# Patient Record
Sex: Male | Born: 1990 | Race: White | Hispanic: No | Marital: Single | Smoking: Current every day smoker
Health system: Southern US, Community
[De-identification: ages and names within clinical notes are randomized; demographics above are authoritative.]

## PROBLEM LIST (undated history)

## (undated) DIAGNOSIS — F1910 Other psychoactive substance abuse, uncomplicated: Secondary | ICD-10-CM

## (undated) DIAGNOSIS — F419 Anxiety disorder, unspecified: Secondary | ICD-10-CM

## (undated) DIAGNOSIS — F329 Major depressive disorder, single episode, unspecified: Secondary | ICD-10-CM

## (undated) DIAGNOSIS — F32A Depression, unspecified: Secondary | ICD-10-CM

## (undated) DIAGNOSIS — F1010 Alcohol abuse, uncomplicated: Secondary | ICD-10-CM

## (undated) HISTORY — DX: Major depressive disorder, single episode, unspecified: F32.9

## (undated) HISTORY — DX: Depression, unspecified: F32.A

## (undated) HISTORY — PX: OTHER SURGICAL HISTORY: SHX169

---
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
## 2011-01-14 IMAGING — CR DG FINGER RING 2+V*L*
1 series · 1 of 1 positions shown · non-contrast
Comparison: None.

CLINICAL DATA: Left fourth finger injury

LEFT RING FINGER 2+V

[view not recorded]
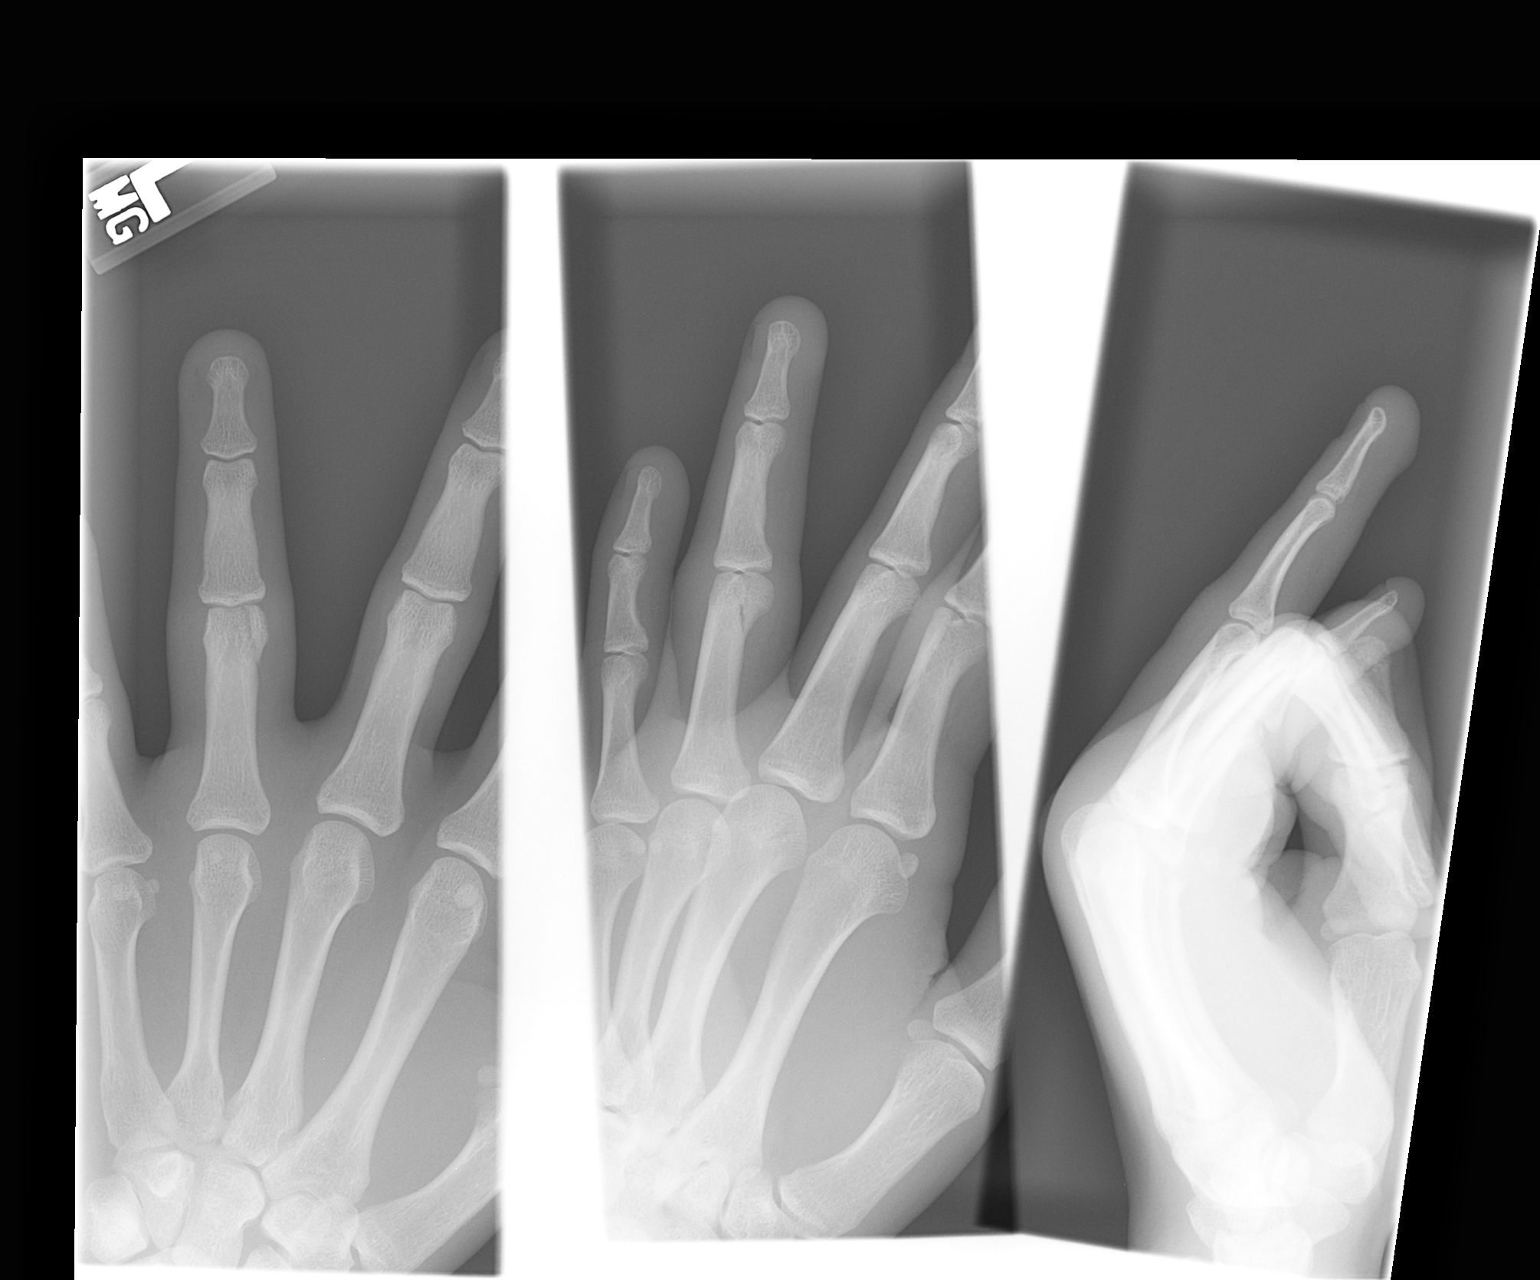

[1 of 1 positions shown; findings below may reference images not displayed]

FINDINGS: There is oblique nondisplaced fracture distal aspect of
proximal phalanx left fourth finger. The fracture line is seen
extending into the articular surface.  No radiopaque foreign body
is noted.
IMPRESSION: Oblique nondisplaced fracture distal aspect of the proximal phalanx
left fourth finger

## 2011-02-25 ENCOUNTER — Emergency Department (HOSPITAL_COMMUNITY)
Admission: EM | Admit: 2011-02-25 | Discharge: 2011-02-25 | Disposition: A | Discharge status: Home / Self Care | Payer: 59 | Attending: Emergency Medicine | Admitting: Emergency Medicine

## 2011-02-25 DIAGNOSIS — X58XXXA Exposure to other specified factors, initial encounter: Secondary | ICD-10-CM | POA: Insufficient documentation

## 2011-02-25 DIAGNOSIS — F39 Unspecified mood [affective] disorder: Secondary | ICD-10-CM

## 2011-02-25 DIAGNOSIS — S51809A Unspecified open wound of unspecified forearm, initial encounter: Secondary | ICD-10-CM | POA: Insufficient documentation

## 2011-02-25 DIAGNOSIS — F1910 Other psychoactive substance abuse, uncomplicated: Secondary | ICD-10-CM | POA: Insufficient documentation

## 2011-02-25 LAB — BASIC METABOLIC PANEL
BUN: 8 mg/dL (ref 6–23)
Calcium: 9.3 mg/dL (ref 8.4–10.5)
Creatinine, Ser: 0.92 mg/dL (ref 0.4–1.5)
GFR calc Af Amer: >60 mL/min (ref >60)
GFR calc non Af Amer: >60 mL/min (ref >60)

## 2011-02-25 LAB — RAPID URINE DRUG SCREEN, HOSP PERFORMED
Benzodiazepines: NOT DETECTED
Cocaine: POSITIVE — AB
Opiates: NOT DETECTED
Tetrahydrocannabinol: POSITIVE — AB

## 2011-02-25 LAB — DIFFERENTIAL
Basophils Relative: 0 % (ref 0–1)
Eosinophils Absolute: 0.1 10*3/uL (ref 0.0–0.7)
Eosinophils Relative: 1 % (ref 0–5)
Lymphs Abs: 2.4 10*3/uL (ref 0.7–4.0)
Monocytes Relative: 7 % (ref 3–12)
Neutrophils Relative %: 75 % (ref 43–77)

## 2011-02-25 LAB — CBC
MCH: 31.6 pg (ref 26.0–34.0)
MCV: 88.2 fL (ref 78.0–100.0)
Platelets: 260 10*3/uL (ref 150–400)
RDW: 13.6 % (ref 11.5–15.5)

## 2011-02-26 ENCOUNTER — Emergency Department (HOSPITAL_COMMUNITY)
Admission: EM | Admit: 2011-02-26 | Discharge: 2011-02-26 | Disposition: A | Discharge status: Home / Self Care | Payer: 59 | Attending: Emergency Medicine | Admitting: Emergency Medicine

## 2011-02-26 DIAGNOSIS — F411 Generalized anxiety disorder: Secondary | ICD-10-CM | POA: Insufficient documentation

## 2011-02-26 DIAGNOSIS — F1910 Other psychoactive substance abuse, uncomplicated: Secondary | ICD-10-CM | POA: Insufficient documentation

## 2011-09-20 ENCOUNTER — Ambulatory Visit: Payer: 59 | Admitting: Family Medicine

## 2011-11-14 ENCOUNTER — Ambulatory Visit: Payer: 59 | Admitting: Family Medicine

## 2012-02-19 ENCOUNTER — Encounter: Payer: Self-pay | Admitting: Family

## 2012-02-19 ENCOUNTER — Ambulatory Visit (INDEPENDENT_AMBULATORY_CARE_PROVIDER_SITE_OTHER): Payer: 59 | Admitting: Family

## 2012-02-19 DIAGNOSIS — G4700 Insomnia, unspecified: Secondary | ICD-10-CM

## 2012-02-19 DIAGNOSIS — J309 Allergic rhinitis, unspecified: Secondary | ICD-10-CM

## 2012-02-19 MED ORDER — FEXOFENADINE-PSEUDOEPHED ER 180-240 MG PO TB24: 1.0000 | ORAL_TABLET | Freq: Every day | ORAL | Status: AC

## 2012-02-19 MED ORDER — TRAZODONE HCL 50 MG PO TABS: 50.0000 mg | ORAL_TABLET | Freq: Every evening | ORAL | Status: DC | PRN

## 2012-02-19 NOTE — Patient Instructions (Signed)

## 2012-02-19 NOTE — Progress Notes (Signed)
  Subjective:    Patient ID: Roy Rogers, male    DOB: 10-15-1991, 21 y.o.   MRN: [id number]  HPI 21 year old white male, nonsmoker new patient to the practice is in with complaints of sneezing, cough, runny nose, itchy and watery eyes x4 days. He has not taken any medications over-the-counter. Has a history of allergic rhinitis.  Patient also has concerns of difficulty falling asleep at night x1 year. His girlfriend reports that he is anxious throughout the day and has difficulty concentrating. Patient declines these concerns. He has a family history of anxiety and his mother, maternal grandmother.   The patient consumes about 3-4 cups of caffeine a day. Denies any illicit drug use   Review of Systems  Constitutional: Negative.   Respiratory: Negative.   Cardiovascular: Negative.   Musculoskeletal: Negative.   Neurological: Negative.   Hematological: Negative.   Psychiatric/Behavioral: Positive for sleep disturbance.   Past Medical History  Diagnosis Date  . Depression     History   Social History  . Marital Status: Single    Spouse Name: N/A    Number of Children: N/A  . Years of Education: 10th Grade   Occupational History  . Not on file.   Social History Main Topics  . Smoking status: Never Smoker   . Smokeless tobacco: Not on file  . Alcohol Use: Yes  . Drug Use: Not on file  . Sexually Active: Not on file   Other Topics Concern  . Not on file   Social History Narrative  . No narrative on file    Past Surgical History  Procedure Date  . None     Family History  Problem Relation Age of Onset  . Mental illness Other   . Diabetes Other   . Early death Other     No Known Allergies  Current Outpatient Prescriptions on File Prior to Visit  Medication Sig Dispense Refill  . traZODone (DESYREL) 50 MG tablet Take 1 tablet (50 mg total) by mouth at bedtime as needed for sleep.  30 tablet  3    BP 124/80  Pulse 73  Temp(Src) 98.1 F (36.7 C)  (Oral)  Ht 5\' 9"  (1.753 m)  Wt 173 lb (78.472 kg)  BMI 25.55 kg/m2  SpO2 98%chart    Objective:   Physical Exam  Constitutional: He is oriented to person, place, and time. He appears well-developed and well-nourished.  Neck: Normal range of motion. Neck supple.  Cardiovascular: Normal rate, regular rhythm and normal heart sounds.   Pulmonary/Chest: Effort normal and breath sounds normal.  Neurological: He is alert and oriented to person, place, and time.  Skin: Skin is warm and dry.  Psychiatric: He has a normal mood and affect.          Assessment & Plan:  Assessment: Allergic rhinitis, insomnia  Plan: Allegra-D 24 hours once daily. Lifestyle modifications to include decreasing his caffeine intake. Patient will stop caffeine intake after 6 PM. Will try trazodone 50 mg once daily at bedtime to help her sleep. His girlfriend alleges that he's taken his medication in the past and has not follicular was effective. Patient isn't sure he's taken it before. I have advised that any control medications with regards to sleep will not be used, due to his potential for abuse and dependence. They may consult psychiatry if they want any benzodiazepines.

## 2012-03-18 ENCOUNTER — Ambulatory Visit: Payer: 59 | Admitting: Family

## 2013-06-23 ENCOUNTER — Telehealth: Payer: Self-pay | Admitting: Family

## 2013-06-23 NOTE — Telephone Encounter (Signed)
Ok with me 

## 2013-06-23 NOTE — Telephone Encounter (Signed)
Pt would like to switch to Dr Kirtland Bouchard. Can I Sch?

## 2013-06-23 NOTE — Telephone Encounter (Signed)
ok 

## 2013-06-24 NOTE — Telephone Encounter (Signed)
Pt notified, appt scheduled

## 2013-07-03 ENCOUNTER — Ambulatory Visit: Payer: 59 | Admitting: Internal Medicine

## 2013-07-09 ENCOUNTER — Encounter: Payer: Self-pay | Admitting: Internal Medicine

## 2013-07-09 ENCOUNTER — Ambulatory Visit (INDEPENDENT_AMBULATORY_CARE_PROVIDER_SITE_OTHER): Payer: 59 | Admitting: Internal Medicine

## 2013-07-09 VITALS — BP 130/80 | HR 71 | Temp 97.9°F | Resp 18 | Wt 169.0 lb

## 2013-07-09 DIAGNOSIS — F4001 Agoraphobia with panic disorder: Secondary | ICD-10-CM

## 2013-07-09 DIAGNOSIS — Z0000 Encounter for general adult medical examination without abnormal findings: Secondary | ICD-10-CM

## 2013-07-09 MED ORDER — ESCITALOPRAM OXALATE 10 MG PO TABS: 10.0000 mg | ORAL_TABLET | Freq: Every day | ORAL | Status: DC

## 2013-07-09 NOTE — Patient Instructions (Addendum)
Take medications as prescribed  Consider counseling as recommended  Return in 6 weeks for followupAgoraphobia Agoraphobia is a type of anxiety disorder. Anxiety is intense fear and worry. Agoraphobia is an intense fear of losing control or having a panic attack while in public. During a panic attack, you may experience the following things:  Dizziness.  Shortness of breath.  Chest pain.  Sweating.  Nausea.  Diarrhea.  Choking sensation.  Trembling or shaking.  Fear of going crazy.  Fear of dying. You may fear not being able to escape a situation, being embarrassed, or not having help available to you. This fear can make it difficult to be in crowds, to travel, or even to leave the house. The panic may seem like more than you can cope with.  RISK FACTORS Agoraphobia can affect people at most any age. It usually starts during the teenage years or in young adulthood. Women have agoraphobia more often than men. No one knows exactly what causes agoraphobia. However, there are certain risk factors that are associated with agoraphobia:  Extreme stress, brought on by traumatic situations, such as physical or sexual abuse.  History of drug or alcohol abuse.  Family history of anxiety. SYMPTOMS   Fear of leaving home.  Fear of being someplace where you cannot get out quickly.  Feelings of helplessness.  Fear of being alone. DIAGNOSIS  To determine if you have agoraphobia, your caregiver will probably ask you about:  Fears you may have.  Changes in your behavior because of these fears.  Drug and alcohol use. TREATMENT  Psychotherapy and medicine can help people with agoraphobia. Medicine can work immediately to reduce symptoms of panic or reduce general levels of anxiety, whereas psychotherapy has been demonstrated to show better long-term outcomes. They often are used together.  Psychotherapy may include:  Cognitive behavioral therapy. This type of therapy helps you  figure out what causes your fears. Your fear might be caused by your thoughts or certain situations. The therapy will then help you learn ways to reduce these fears.  Exposure therapy. Often, the more fear is avoided, the greater it becomes. Exposure therapy helps you face the things that cause your fears by learning relaxation techniques while you approach what you fear. Meditation and deep breathing are two examples.  Medicines for agoraphobia may include:  Antidepressants. These drugs affect certain chemicals in your brain. Selective serotonin reuptake inhibitors are one type that can decrease general levels of anxiety and help prevent panic attacks.  Benzodiazepines. These drugs block feelings of anxiety and panic. However, prolonged use can lead to physiological and psychological dependence and can cause withdrawal symptoms when discontinued.  Beta-blockers. These medicines can keep you from getting excited. They have an effect on the nerve cells that cause that feeling. The drugs help you feel less tense and anxious. HOME CARE INSTRUCTIONS  Take medicine as directed by your caregiver. Follow the directions carefully. Make sure your caregiver knows about all other medicines you take. Do not start any new medicine unless your caregiver says it is okay. Do not take more medicine than is prescribed. Alert your caregiver if you plan to discontinue a medicine that he or she has prescribed to you for anxiety.  Try not to avoid fearful situations. Avoidance can increase your fears and could lead to a fear of leaving your home.  Learn relaxation techniques. Consider taking a class in meditation or deep breathing to help you stay calm.  Consider joining a support group. Ask  your caregiver for a list of groups in your area.  Do not drink alcohol or use drugs, especially if you are taking medicine for anxiety. SEEK MEDICAL CARE IF:  You are missing major life activities, such as school or work,  because of your fears.  You have symptoms of depression, such as depressed mood, loss of interest, or negative feelings about yourself. SEEK IMMEDIATE MEDICAL CARE IF:   You cannot leave your home.  You have trouble breathing.  You have chest pain.  You think about hurting yourself or someone else. FOR MORE INFORMATION Anxiety Disorders Association of America: www.adaa.org Document Released: 02/28/2011 Document Revised: 04/08/2012 Document Reviewed: 02/28/2011 [facility] Patient Information 2014 [place], [place]. Health Maintenance, 14- to 70-Year-Old SCHOOL PERFORMANCE After high school completion, the young adult may be attending college, Scientist, product/process development or vocational school, or entering the [employer] or the work force. SOCIAL AND EMOTIONAL DEVELOPMENT The young adult establishes adult relationships and explores sexual identity. Young adults may be living at home or in a college dorm or apartment. Increasing independence is important with young adults. Throughout adolescence, teens should assume responsibility of their own health care. IMMUNIZATIONS Most young adults should be fully vaccinated. A booster dose of Tdap (tetanus, diphtheria, and pertussis, or "whooping cough"), a dose of meningococcal vaccine to protect against a certain type of bacterial meningitis, hepatitis A, human papillomarvirus (HPV), chickenpox, or measles vaccines may be indicated, if not given at an earlier age. Annual influenza or "flu" vaccination should be considered during flu season.  TESTING Annual screening for vision and hearing problems is recommended. Vision should be screened objectively at least once between 3 and 67 years of age. The young adult may be screened for anemia or tuberculosis. Young adults should have a blood test to check for high cholesterol during this time period. Young adults should be screened for use of alcohol and drugs. If the young adult is sexually active, screening for sexually transmitted  infections, pregnancy, or HIV may be performed. Screening for cervical cancer should be performed within 3 years of beginning sexual activity. NUTRITION AND ORAL HEALTH  Adequate calcium intake is important. Consume 3 servings of low-fat milk and dairy products daily. For those who do not drink milk or consume dairy products, calcium enriched foods, such as juice, bread, or cereal, dark, leafy greens, or canned fish are alternate sources of calcium.  Drink plenty of water. Limit fruit juice to 8 to 12 ounces per day. Avoid sugary beverages or sodas.  Discourage skipping meals, especially breakfast. Teens should eat a good variety of vegetables and fruits, as well as lean meats.  Avoid high fat, high salt, and high sugar foods, such as candy, chips, and cookies.  Encourage young adults to participate in meal planning and preparation.  Eat meals together as a family whenever possible. Encourage conversation at mealtime.  Limit fast food choices and eating out at restaurants.  Brush teeth twice a day and floss.  Schedule dental exams twice a year. SLEEP Regular sleep habits are important. PHYSICAL, SOCIAL, AND EMOTIONAL DEVELOPMENT  One hour of regular physical activity daily is recommended. Continue to participate in sports.  Encourage young adults to develop their own interests and consider community service or volunteerism.  Provide guidance to the young adult in making decisions about college and work plans.  Make sure that young adults know that they should never be in a situation that makes them uncomfortable, and they should tell partners if they do not want to engage  in sexual activity.  Talk to the young adult about body image. Eating disorders may be noted at this time. Young adults may also be concerned about being overweight. Monitor the young adult for weight gain or loss.  Mood disturbances, depression, anxiety, alcoholism, or attention problems may be noted in young  adults. Talk to the caregiver if there are concerns about mental illness.  Negotiate limit setting and independent decision making.  Encourage the young adult to handle conflict without physical violence.  Avoid loud noises which may impair hearing.  Limit television and computer time to 2 hours per day. Individuals who engage in excessive sedentary activity are more likely to become overweight. RISK BEHAVIORS  Sexually active young adults need to take precautions against pregnancy and sexually transmitted infections. Talk to young adults about contraception.  Provide a tobacco-free and drug-free environment for the young adult. Talk to the young adult about drug, tobacco, and alcohol use among friends or at friends' homes. Make sure the young adult knows that smoking tobacco or marijuana and taking drugs have health consequences and may impact brain development.  Teach the young adult about appropriate use of over-the-counter or prescription medicines.  Establish guidelines for driving and for riding with friends.  Talk to young adults about the risks of drinking and driving or boating. Encourage the young adult to call you if he or she or friends have been drinking or using drugs.  Remind young adults to wear seat belts at all times in cars and life vests in boats.  Young adults should always wear a properly fitted helmet when they are riding a bicycle.  Use caution with all-terrain vehicles (ATVs) or other motorized vehicles.  Do not keep handguns in the home. (If you do, the gun and ammunition should be locked separately and out of the young adult's access.)  Equip your home with smoke detectors and change the batteries regularly. Make sure all family members know the fire escape plans for your home.  Teach young adults not to swim alone and not to dive in shallow water.  All individuals should wear sunscreen that protects against UVA and UVB light with at least a sun protection  factor (SPF) of 30 when out in the sun. This minimizes sun burning. WHAT'S NEXT? Young adults should visit their pediatrician or family physician yearly. By young adulthood, health care should be transitioned to a family physician or internal medicine specialist. Sexually active females may want to begin annual physical exams with a gynecologist. Document Released: 01/03/2007 Document Revised: 12/31/2011 Document Reviewed: 01/23/2007 [facility] [place] [facility] Patient Information 2014 [place], [place].
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
## 2013-08-20 ENCOUNTER — Ambulatory Visit: Payer: 59 | Admitting: Internal Medicine

## 2013-08-20 DIAGNOSIS — Z0289 Encounter for other administrative examinations: Secondary | ICD-10-CM

## 2014-05-07 ENCOUNTER — Encounter (HOSPITAL_COMMUNITY): Payer: Self-pay | Admitting: Emergency Medicine

## 2014-05-07 ENCOUNTER — Emergency Department (HOSPITAL_COMMUNITY)
Admission: EM | Admit: 2014-05-07 | Discharge: 2014-05-07 | Disposition: A | Discharge status: Home / Self Care | Payer: 59 | Attending: Emergency Medicine | Admitting: Emergency Medicine

## 2014-05-07 DIAGNOSIS — F17200 Nicotine dependence, unspecified, uncomplicated: Secondary | ICD-10-CM | POA: Insufficient documentation

## 2014-05-07 DIAGNOSIS — F410 Panic disorder [episodic paroxysmal anxiety] without agoraphobia: Secondary | ICD-10-CM | POA: Insufficient documentation

## 2014-05-07 DIAGNOSIS — F419 Anxiety disorder, unspecified: Secondary | ICD-10-CM

## 2014-05-07 DIAGNOSIS — F1010 Alcohol abuse, uncomplicated: Secondary | ICD-10-CM | POA: Insufficient documentation

## 2014-05-07 HISTORY — DX: Alcohol abuse, uncomplicated: F10.10

## 2014-05-07 HISTORY — DX: Anxiety disorder, unspecified: F41.9

## 2014-05-07 MED ORDER — LORAZEPAM 1 MG PO TABS
1.0000 mg | ORAL_TABLET | Freq: Once | ORAL | Status: AC
  Administered 2014-05-07: 1 mg via ORAL
  Filled 2014-05-07: qty 1

## 2014-05-07 MED ORDER — LORAZEPAM 1 MG PO TABS: 2.0000 mg | ORAL_TABLET | Freq: Once | ORAL | Status: DC

## 2014-05-07 MED ORDER — LORAZEPAM 1 MG PO TABS: 1.0000 mg | ORAL_TABLET | Freq: Three times a day (TID) | ORAL | Status: DC | PRN

## 2014-05-07 NOTE — ED Notes (Signed)
Per pt, has had anxiety for past year.  Pt here today with increased tremors since yesterday.  States he fells tightness in chest and very anxious.  Pt does admit to drinking daily.  Last drink yesterday.  Pt takes no medications at this time.

## 2014-05-07 NOTE — ED Provider Notes (Signed)
TIME SEEN: 5:45 PM  CHIEF COMPLAINT: Anxiety  HPI: Patient is a 23 y.o. M with history of anxiety, panic attacks, alcohol abuse who presents to the emergency department with anxiety. He states that normally he is able to control his anxiety by talking to his girlfriend but they are not together anymore. He states that he has had panic attacks recently that he has not been able to control. He states he was seen by his primary care physician who told him he needed to see a different outpatient provider for management for this. He states he came to the emergency department because he did not know what else to do. He states he has tried alcohol every day. Last drink yesterday. No history of withdrawal seizures. He states he is not interested in detox or rehabilitation at this time. Denies any drug use. Denies to me any chest pain but states he has had palpitations. No shortness of breath. No nausea, vomiting, dizziness or diaphoresis. No lower extremity swelling or pain. No history of cardiac disease, PE or DVT, recent prolonged immobilization such as long flight or hospitalization, recent fracture or surgery, trauma. Denies any SI, HI or hallucinations.  ROS: See HPI Constitutional: no fever  Eyes: no drainage  ENT: no runny nose   Cardiovascular:  no chest pain  Resp: no SOB  GI: no vomiting GU: no dysuria Integumentary: no rash  Allergy: no hives  Musculoskeletal: no leg swelling  Neurological: no slurred speech ROS otherwise negative  PAST MEDICAL HISTORY/PAST SURGICAL HISTORY:  Past Medical History  Diagnosis Date  . Depression   . ETOH abuse   . Anxiety     MEDICATIONS:  Prior to Admission medications   Not on File    ALLERGIES:  No Known Allergies  SOCIAL HISTORY:  History  Substance Use Topics  . Smoking status: Current Every Day Smoker -- 0.50 packs/day    Types: Cigarettes  . Smokeless tobacco: Not on file  . Alcohol Use: Yes     Comment: sometimes daily    FAMILY  HISTORY: Family History  Problem Relation Age of Onset  . Mental illness Other   . Diabetes Other   . Early death Other     EXAM: BP 161/107  Pulse 97  Temp(Src) 98.4 F (36.9 C) (Oral)  Resp 18  SpO2 97% CONSTITUTIONAL: Alert and oriented and responds appropriately to questions. Well-appearing; well-nourished HEAD: Normocephalic EYES: Conjunctivae clear, PERRL ENT: normal nose; no rhinorrhea; moist mucous membranes; pharynx without lesions noted NECK: Supple, no meningismus, no LAD  CARD: Regular and tachycardic; S1 and S2 appreciated; no murmurs, no clicks, no rubs, no gallops RESP: Normal chest excursion without splinting or tachypnea; breath sounds clear and equal bilaterally; no wheezes, no rhonchi, no rales,  ABD/GI: Normal bowel sounds; non-distended; soft, non-tender, no rebound, no guarding BACK:  The back appears normal and is non-tender to palpation, there is no CVA tenderness EXT: Normal ROM in all joints; non-tender to palpation; no edema; normal capillary refill; no cyanosis    SKIN: Normal color for age and race; warm NEURO: Moves all extremities equally; sensation to light touch intact diffusely, cranial nerves II through XII intact, normal gait; no tremors PSYCH: The patient appears anxious. He is shaking his legs throughout the entire exam. Denies SI, HI or hallucinations. Grooming and personal hygiene are appropriate.  MEDICAL DECISION MAKING: Patient here with complaints of panic attacks, anxiety. He denies any SI, HI or hallucinations and contracts for safety. He states he  has had palpitations with his panic attacks but none currently and no chest pain or shortness of breath. He has no risk factors for ACS other than tobacco use and no risk factors for pulmonary embolus. Doubt dissection given he is not having any pain. He denies wanting to rehabilitation or detox at this time. I feel he is safe to be discharged home. He has received Ativan the emergency department  he is feeling better. Discussed with patient that I can discharge him with a very small prescription for Ativan but that this medication in combination with alcohol can be dangerous. I discussed with him and his aunt who is at bedside that I strongly recommend rehabilitation and we'll give him outpatient resources. Have counseled him on alcohol cessation extensively. Have discussed with patient he will need to followup with outpatient psychiatry for further prescriptions for medications to control his anxiety and that benzodiazepines or not a long-term solution. Patient verbalizes understanding and is comfortable with plan.      EKG Interpretation  Date/Time:  Friday May 07 2014 16:48:16 EDT Ventricular Rate:  119 PR Interval:  126 QRS Duration: 78 QT Interval:  285 QTC Calculation: 401 R Axis:   18 Text Interpretation:  Sinus tachycardia Artifact in lead(s) III aVF V5 and baseline wander in lead(s) V2 Confirmed by Juleen ChinaKOHUT  MD, STEPHEN ([id number]) on 05/07/2014 4:52:57 PM        Layla MawKristen N Ward, DO 05/07/14 [id number]

## 2014-05-07 NOTE — Discharge Instructions (Signed)
Alcohol Use Disorder Alcohol use disorder is a mental disorder. It is not a one-time incident of heavy drinking. Alcohol use disorder is the excessive and uncontrollable use of alcohol over time that leads to problems with functioning in one or more areas of daily living. People with this disorder risk harming themselves and others when they drink to excess. Alcohol use disorder also can cause other mental disorders, such as mood and anxiety disorders, and serious physical problems. People with alcohol use disorder often misuse other drugs.  Alcohol use disorder is common and widespread. Some people with this disorder drink alcohol to cope with or escape from negative life events. Others drink to relieve chronic pain or symptoms of mental illness. People with a family history of alcohol use disorder are at higher risk of losing control and using alcohol to excess.  SYMPTOMS  Signs and symptoms of alcohol use disorder may include the following:   Consumption ofalcohol inlarger amounts or over a longer period of time than intended.  Multiple unsuccessful attempts to cutdown or control alcohol use.   A great deal of time spent obtaining alcohol, using alcohol, or recovering from the effects of alcohol (hangover).  A strong desire or urge to use alcohol (cravings).   Continued use of alcohol despite problems at work, school, or home because of alcohol use.   Continued use of alcohol despite problems in relationships because of alcohol use.  Continued use of alcohol in situations when it is physically hazardous, such as driving a car.  Continued use of alcohol despite awareness of a physical or psychological problem that is likely related to alcohol use. Physical problems related to alcohol use can involve the brain, heart, liver, stomach, and intestines. Psychological problems related to alcohol use include intoxication, depression, anxiety, psychosis, delirium, and dementia.   The need for  increased amounts of alcohol to achieve the same desired effect, or a decreased effect from the consumption of the same amount of alcohol (tolerance).  Withdrawal symptoms upon reducing or stopping alcohol use, or alcohol use to reduce or avoid withdrawal symptoms. Withdrawal symptoms include:  Racing heart.  Hand tremor.  Difficulty sleeping.  Nausea.  Vomiting.  Hallucinations.  Restlessness.  Seizures. DIAGNOSIS Alcohol use disorder is diagnosed through an assessment by your caregiver. Your caregiver may start by asking three or four questions to screen for excessive or problematic alcohol use. To confirm a diagnosis of alcohol use disorder, at least two symptoms (see SYMPTOMS) must be present within a 4045-month period. The severity of alcohol use disorder depends on the number of symptoms:  Mild--two or three.  Moderate--four or five.  Severe--six or more. Your caregiver may perform a physical exam or use results from lab tests to see if you have physical problems resulting from alcohol use. Your caregiver may refer you to a mental health professional for evaluation. TREATMENT  Some people with alcohol use disorder are able to reduce their alcohol use to low-risk levels. Some people with alcohol use disorder need to quit drinking alcohol. When necessary, mental health professionals with specialized training in substance use treatment can help. Your caregiver can help you decide how severe your alcohol use disorder is and what type of treatment you need. The following forms of treatment are available:   Detoxification. Detoxification involves the use of prescription medication to prevent alcohol withdrawal symptoms in the first week after quitting. This is important for people with a history of symptoms of withdrawal and for heavy drinkers who are  likely to have withdrawal symptoms. Alcohol withdrawal can be dangerous and, in severe cases, cause death. Detoxification is usually  provided in a hospital or in-patient substance use treatment facility.  Counseling or talk therapy. Talk therapy is provided by substance use treatment counselors. It addresses the reasons people use alcohol and ways to keep them from drinking again. The goals of talk therapy are to help people with alcohol use disorder find healthy activities and ways to cope with life stress, to identify and avoid triggers for alcohol use, and to handle cravings, which can cause relapse.  Medication.Different medications can help treat alcohol use disorder through the following actions:  Decrease alcohol cravings.  Decrease the positive reward response felt from alcohol use.  Produce an uncomfortable physical reaction when alcohol is used (aversion therapy).  Support groups. Support groups are run by people who have quit drinking. They provide emotional support, advice, and guidance. These forms of treatment are often combined. Some people with alcohol use disorder benefit from intensive combination treatment provided by specialized substance use treatment centers. Both inpatient and outpatient treatment programs are available. Document Released: 11/15/2004 Document Revised: 06/10/2013 Document Reviewed: 01/15/2013 [facility] Patient Information 2015 [place], [place]. This information is not intended to replace advice given to you by your health care provider. Make sure you discuss any questions you have with your health care provider.  Panic Attacks Panic attacks are sudden, short-livedsurges of severe anxiety, fear, or discomfort. They may occur for no reason when you are relaxed, when you are anxious, or when you are sleeping. Panic attacks may occur for a number of reasons:   Healthy people occasionally have panic attacks in extreme, life-threatening situations, such as war or natural disasters. Normal anxiety is a protective mechanism of the body that helps [place] react to danger (fight or flight  response).  Panic attacks are often seen with anxiety disorders, such as panic disorder, social anxiety disorder, generalized anxiety disorder, and phobias. Anxiety disorders cause excessive or uncontrollable anxiety. They may interfere with your relationships or other life activities.  Panic attacks are sometimes seen with other mental illnesses, such as depression and posttraumatic stress disorder.  Certain medical conditions, prescription medicines, and drugs of abuse can cause panic attacks. SYMPTOMS  Panic attacks start suddenly, peak within 20 minutes, and are accompanied by four or more of the following symptoms:  Pounding heart or fast heart rate (palpitations).  Sweating.  Trembling or shaking.  Shortness of breath or feeling smothered.  Feeling choked.  Chest pain or discomfort.  Nausea or strange feeling in your stomach.  Dizziness, light-headedness, or feeling like you will faint.  Chills or hot flushes.  Numbness or tingling in your lips or hands and feet.  Feeling that things are not real or feeling that you are not yourself.  Fear of losing control or going crazy.  Fear of dying. Some of these symptoms can mimic serious medical conditions. For example, you may think you are having a heart attack. Although panic attacks can be very scary, they are not life threatening. DIAGNOSIS  Panic attacks are diagnosed through an assessment by your health care provider. Your health care provider will ask questions about your symptoms, such as where and when they occurred. Your health care provider will also ask about your medical history and use of alcohol and drugs, including prescription medicines. Your health care provider may order blood tests or other studies to rule out a serious medical condition. Your health care provider may refer you  to a mental health professional for further evaluation. TREATMENT   Most healthy people who have one or two panic attacks in an  extreme, life-threatening situation will not require treatment.  The treatment for panic attacks associated with anxiety disorders or other mental illness typically involves counseling with a mental health professional, medicine, or a combination of both. Your health care provider will help determine what treatment is best for you.  Panic attacks due to physical illness usually go away with treatment of the illness. If prescription medicine is causing panic attacks, talk with your health care provider about stopping the medicine, decreasing the dose, or substituting another medicine.  Panic attacks due to alcohol or drug abuse go away with abstinence. Some adults need professional help in order to stop drinking or using drugs. HOME CARE INSTRUCTIONS   Take all medicines as directed by your health care provider.   Schedule and attend follow-up visits as directed by your health care provider. It is important to keep all your appointments. SEEK MEDICAL CARE IF:  You are not able to take your medicines as prescribed.  Your symptoms do not improve or get worse. SEEK IMMEDIATE MEDICAL CARE IF:   You experience panic attack symptoms that are different than your usual symptoms.  You have serious thoughts about hurting yourself or others.  You are taking medicine for panic attacks and have a serious side effect. MAKE SURE YOU:  Understand these instructions.  Will watch your condition.  Will get help right away if you are not doing well or get worse. Document Released: 10/08/2005 Document Revised: 10/13/2013 Document Reviewed: 05/22/2013 [facility] Patient Information 2015 [place], [place]. This information is not intended to replace advice given to you by your health care provider. Make sure you discuss any questions you have with your health care provider.    Emergency Department Resource Guide 1) Find a Doctor and Pay Out of Pocket Although you won't have to find out who is covered by  your insurance plan, it is a good idea to ask around and get recommendations. You will then need to call the office and see if the doctor you have chosen will accept you as a new patient and what types of options they offer for patients who are self-pay. Some doctors offer discounts or will set up payment plans for their patients who do not have insurance, but you will need to ask so you aren't surprised when you get to your appointment.  2) Contact Your Local Health Department Not all health departments have doctors that can see patients for sick visits, but many do, so it is worth a call to see if yours does. If you don't know where your local health department is, you can check in your phone book. The CDC also has a tool to help you locate your state's health department, and many state websites also have listings of all of their local health departments.  3) Find a Walk-in Clinic If your illness is not likely to be very severe or complicated, you may want to try a walk in clinic. These are popping up all over the country in pharmacies, drugstores, and shopping centers. They're usually staffed by nurse practitioners or physician assistants that have been trained to treat common illnesses and complaints. They're usually fairly quick and inexpensive. However, if you have serious medical issues or chronic medical problems, these are probably not your best option.  No Primary Care Doctor: - Call Health Connect at  [phone] - they  can help you locate a primary care doctor that  accepts your insurance, provides certain services, etc. - Physician Referral Service- [phone]  Chronic Pain Problems: Organization         Address  Phone   Notes  [facility]  [phone] Patients need to be referred by their primary care doctor.   Medication Assistance: Organization         Address  Phone   Notes  [facility] Medication [facility] [address] [place], [place] [address] [phone] --Must be a resident of [facility] -- Must have NO insurance coverage whatsoever (no Medicaid/ Medicare, etc.) -- The pt. MUST have a primary care doctor that directs their care regularly and follows them in the community   MedAssist  [phone]   [employer]  [phone]    Agencies that provide inexpensive medical care: Organization         Address  Phone   Notes  [place]  [phone]   [facility]    [phone]   [facility] [place], [place] [address] [phone]   [place] [address], [place] [phone]   Planned Parenthood    [phone]   [facility]    [phone]   [place] [address] [place], [place] Phone:  [phone], Fax:  [phone] Hours of Operation:  9 am - 6 pm, M-F.  Also accepts Medicaid/Medicare and self-pay.  [facility] [place], [address], [place] Phone: [phone], Fax: [phone]. Hours of Operation:  8:30 am - 5:30 pm, M-F.  Also accepts Medicaid and self-pay.  [facility] [address], [place] Phone: [phone]   [place], [place], [place] [phone], Ext. [phone] Mondays & Thursdays: 7-9 AM.  First 15 patients are seen on a first come, first serve basis.    [place] Providers:  Organization         Address  Phone   Notes  [facility] [address], [place] [phone] Also accepts self-pay patients.  [facility] [address], [place]  [phone]   [place], [address], [place] [phone]   [facility] [address], [place] [phone]   Lucianne Lei [address], [place]   [phone] Only accepts [place] Access [place] patients after they have their name applied to their card.   Self-Pay (no insurance) in [facility]:  Organization         Address  Phone   Notes  Sickle Cell Patients, [facility] [place] [phone]   [facility] Urgent Care [place] [phone]   [facility] [place]  [place], [place], [place] [phone]   [facility]/Dr. Osei-Bonsu  [address] [place] or [address], [place] [phone] Phone number for both [place] and [place] locations is the same.  Urgent Medical and [facility] [address] [place] [phone]   [facility] [place] or [place]  [address] [phone]   [facility] [place] [phone], phone; [phone], fax Sees patients 1st and 3rd Saturday of every month.  Must not qualify for public or private insurance (i.e. Medicaid, Medicare, [place] Health Choice, Veterans' Benefits)  Household income should be no more than 200% of the poverty level The clinic cannot treat you if you are pregnant or think you are pregnant  Sexually transmitted diseases are not treated at the clinic.    Dental Care: Organization         Address  Phone  Notes  [facility] Department of [facility] [place] [phone] Accepts children up to age 23 who are enrolled in [place] or [place]; pregnant women with a Medicaid card; and children who have applied for Medicaid or [place] Health Choice, but were declined, whose parents can pay a reduced fee at time of service.  [facility] Department of [facility]  [address], [place] [phone] Accepts children up to age 59 who are enrolled  in [place] or [place]; pregnant women with a Medicaid card; and children who have applied for Medicaid or [place] Health Choice, but were declined, whose parents can pay a reduced fee at time of service.  [facility] Adult [facility] Access PROGRAM  [place] [phone] Patients are seen by appointment only. Walk-ins are not accepted. [place] will see patients 13 years of age and older. Monday - Tuesday (8am-5pm) Most Wednesdays (8:30-5pm) $30 per visit, cash only  [facility] Adult Dental Access PROGRAM  [address], [facility] [phone] Patients are seen by appointment only. Walk-ins are not accepted. [place] will see patients 18 years of age and older. One Wednesday Evening (Monthly: Volunteer Based).  $30 per visit, cash only  Ideal  [phone] for adults; Children under age 52, call Graduate Pediatric Dentistry at [phone]. Children aged 67-14, please call [phone] to request a pediatric application.  Dental services are provided in all areas of dental care including fillings, crowns and bridges, complete and partial dentures, implants, gum treatment, root canals, and extractions. Preventive care is also provided. Treatment is provided to both adults and children. Patients are selected via a lottery and there is often a waiting list.   [facility] [address], [place]  [phone] [email or web address]   [facility] [address] [place], [place] [phone], Ext. [phone] Second and Fourth Thursday of each month, opens at 6:30 AM; Clinic ends at 9 AM.  Patients are seen on a first-come first-served basis, and a limited number are seen during each clinic.   [facility]  [address] Hillard Danker [place], [place] [phone]   Eligibility Requirements You must have lived in [place], [place], or [place] counties for at least the last three months.   You cannot be  eligible for state or federal sponsored [employer], including [employer], [place], or [employer].   You generally cannot be eligible for healthcare insurance through your employer.    How to apply: Eligibility screenings are held every Tuesday and Wednesday afternoon from 1:00 pm until 4:00 pm. You do not need an appointment for the interview!  [facility] [address] [place], [place]   [place]  Department  [phone]   [place]  [phone]    Behavioral Health Resources in the Community: Intensive Outpatient Programs Organization         Address  Phone  Notes  [place]. [address] [place], [place] [phone]   [facility] Outpatient [address], [place], [place]   ADS: Alcohol & Drug Svcs [address], [place], [place]   [place] [address]  [place], [place] or [phone]   Substance Abuse Resources Organization         Address  Phone  Notes  Alcohol and Drug Services  [phone]   [place]  [phone]   [facility] [place]   Chinita Pester  [phone]   Residential & Outpatient Substance Abuse Program  [phone]   Psychological Services Organization         Address  Phone  Notes  [facility] [place]  [place]  [phone]   [place] [address] [place] or [phone]    Mobile Crisis Teams Organization         Address  Phone  Notes  [facility], Mobile Crisis Care Unit  [phone]   [facility]  [address] [place], [place]   [place] [address] [place] [phone]    Self-Help/Support Groups Organization          Address  Phone             Notes  [place]. of [place] - variety of support groups  [place] Call for more information  Narcotics Anonymous (NA), [facility] [address], [employer] [place]  2 meetings at this location   Special educational needs teacher         Address  Phone  Notes  [facility]idential Treatment [place],    [place]  [phone]   [facility]  [address] [place] [address]98338, [place], [place]   [place], [place] [phone] Admissions: 8am-3pm M-F  Incentives Substance [place] [address]    [place], [place] [phone]   [facility] [address] [place], [place], [place]   [facility] [address]  [place], [place]   [facility] - Intensive Outpatient [address], [place], [place]   [facility] [place] [place].,  [place], [place] [phone] or [phone]   [facility] Services (RTS) [address], [place], [place] Accepts Medicaid  [place] [address],  [place] [phone] Substance Abuse/Addiction Treatment   [facility] Organization         Address  Phone  Notes  [facility]  [phone]   Domenic Schwab, PhD [address] Arlis Porta [place], [place]   [phone] or [phone]   [place], [place] [phone]   [place] [address] [place], [place] [phone] Insurance/Medicaid/sponsorship through [employer] and Families [address], Y[address] 7[address]  [place], [place] [phone] [place], [place] [phone]    Dr. Adele Schilder  [phone]   [facility] Dept. 1) [address] [place] 2) [place] 3)  [place] [address], [place] [phone]  [phone]   [place] [phone] or [phone] (After Hours)

## 2014-05-13 ENCOUNTER — Encounter: Payer: Self-pay | Admitting: Internal Medicine

## 2014-05-13 ENCOUNTER — Ambulatory Visit (INDEPENDENT_AMBULATORY_CARE_PROVIDER_SITE_OTHER): Payer: 59 | Admitting: Internal Medicine

## 2014-05-13 VITALS — BP 144/80 | HR 92 | Temp 97.7°F | Ht 69.0 in | Wt 178.0 lb

## 2014-05-13 DIAGNOSIS — F4001 Agoraphobia with panic disorder: Secondary | ICD-10-CM

## 2014-05-13 MED ORDER — LORAZEPAM 1 MG PO TABS: 1.0000 mg | ORAL_TABLET | Freq: Three times a day (TID) | ORAL | Status: DC | PRN

## 2014-05-13 NOTE — Progress Notes (Signed)
Pre visit review using our clinic review tool, if applicable. No additional management support is needed unless otherwise documented below in the visit note. 

## 2014-05-13 NOTE — Progress Notes (Signed)
Subjective:    Patient ID: Roy Rogers, male    DOB: 02-03-1991, 23 y.o.   MRN: [id number]  HPI  23 year old patient who was initially seen in our practice in September of last year.  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  He was placed on Lexapro, but took this only briefly and felt that he did poorly on this medication.  He states that it made him feel even more aggressive.  It was also suggested that he follow up with psychiatry.  One week ago.  He presented to the ED due to extreme anxiety.  He was drinking alcohol heavily at that time and has been abstinent.  He was placed on lorazepam and he states that he has done better.  More recently than he has in some time.  He is accompanied by an aunt today.  He seems very motivated.  He is on probation, which requires twice weekly drug testing.   Past Medical History  Diagnosis Date  . Depression   . ETOH abuse   . Anxiety     History   Social History  . Marital Status: Single    Spouse Name: N/A    Number of Children: N/A  . Years of Education: 10th Grade   Occupational History  . Not on file.   Social History Main Topics  . Smoking status: Current Every Day Smoker -- 0.50 packs/day    Types: Cigarettes  . Smokeless tobacco: Not on file  . Alcohol Use: Yes     Comment: sometimes daily  . Drug Use: No  . Sexual Activity: Not on file   Other Topics Concern  . Not on file   Social History Narrative  . No narrative on file    Past Surgical History  Procedure Laterality Date  . None      Family History  Problem Relation Age of Onset  . Mental illness Other   . Diabetes Other   . Early death Other     No Known Allergies  No current outpatient prescriptions on file prior to visit.   No current facility-administered medications on file prior to visit.    BP 144/80  Pulse 92  Temp(Src) 97.7 F (36.5 C) (Oral)  Ht 5\' 9"  (1.753 m)  Wt 178 lb (80.74 kg)  BMI 26.27 kg/m2    Review of  Systems  Constitutional: Negative for fever, chills, appetite change and fatigue.  HENT: Negative for congestion, dental problem, ear pain, hearing loss, sore throat, tinnitus, trouble swallowing and voice change.   Eyes: Negative for pain, discharge and visual disturbance.  Respiratory: Negative for cough, chest tightness, wheezing and stridor.   Cardiovascular: Negative for chest pain, palpitations and leg swelling.  Gastrointestinal: Negative for nausea, vomiting, abdominal pain, diarrhea, constipation, blood in stool and abdominal distention.  Genitourinary: Negative for urgency, hematuria, flank pain, discharge, difficulty urinating and genital sores.  Musculoskeletal: Negative for arthralgias, back pain, gait problem, joint swelling, myalgias and neck stiffness.  Skin: Negative for rash.  Neurological: Negative for dizziness, syncope, speech difficulty, weakness, numbness and headaches.  Hematological: Negative for adenopathy. Does not bruise/bleed easily.  Psychiatric/Behavioral: Positive for behavioral problems. Negative for dysphoric mood. The patient is nervous/anxious.        Objective:   Physical Exam  Constitutional: He is oriented to person, place, and time. He appears well-developed.  HENT:  Head: Normocephalic.  Right Ear: External ear normal.  Left Ear: External ear normal.  Eyes: Conjunctivae  and EOM are normal.  Neck: Normal range of motion.  Cardiovascular: Normal rate and normal heart sounds.   Pulmonary/Chest: Breath sounds normal.  Abdominal: Bowel sounds are normal.  Musculoskeletal: Normal range of motion. He exhibits no edema and no tenderness.  Neurological: He is alert and oriented to person, place, and time.  Skin:  Multiple tattoos  Psychiatric: He has a normal mood and affect. His behavior is normal.          Assessment & Plan:   Agoraphobia with panic disorder.  Resources for psychiatric referral.  Discussed at length.  He does have excellent  health coverage.  A prescription for a 30 day supply of lorazepam dispensed.  He is aware that further refills will be dispensed by psychiatry

## 2014-05-13 NOTE — Patient Instructions (Signed)
Psychiatric followup as discussedAgoraphobia Agoraphobia is a type of anxiety disorder. Anxiety is intense fear and worry. Agoraphobia is an intense fear of losing control or having a panic attack while in public. During a panic attack, you may experience the following things:  Dizziness.  Shortness of breath.  Chest pain.  Sweating.  Nausea.  Diarrhea.  Choking sensation.  Trembling or shaking.  Fear of going crazy.  Fear of dying. You may fear not being able to escape a situation, being embarrassed, or not having help available to you. This fear can make it difficult to be in crowds, to travel, or even to leave the house. The panic may seem like more than you can cope with.  RISK FACTORS Agoraphobia can affect people at most any age. It usually starts during the teenage years or in young adulthood. Women have agoraphobia more often than men. No one knows exactly what causes agoraphobia. However, there are certain risk factors that are associated with agoraphobia:  Extreme stress, brought on by traumatic situations, such as physical or sexual abuse.  History of drug or alcohol abuse.  Family history of anxiety. SYMPTOMS   Fear of leaving home.  Fear of being someplace where you cannot get out quickly.  Feelings of helplessness.  Fear of being alone. DIAGNOSIS  To determine if you have agoraphobia, your caregiver will probably ask you about:  Fears you may have.  Changes in your behavior because of these fears.  Drug and alcohol use. TREATMENT  Psychotherapy and medicine can help people with agoraphobia. Medicine can work immediately to reduce symptoms of panic or reduce general levels of anxiety, whereas psychotherapy has been demonstrated to show better long-term outcomes. They often are used together.  Psychotherapy may include:  Cognitive behavioral therapy. This type of therapy helps you figure out what causes your fears. Your fear might be caused by your  thoughts or certain situations. The therapy will then help you learn ways to reduce these fears.  Exposure therapy. Often, the more fear is avoided, the greater it becomes. Exposure therapy helps you face the things that cause your fears by learning relaxation techniques while you approach what you fear. Meditation and deep breathing are two examples.  Medicines for agoraphobia may include:  Antidepressants. These drugs affect certain chemicals in your brain. Selective serotonin reuptake inhibitors are one type that can decrease general levels of anxiety and help prevent panic attacks.  Benzodiazepines. These drugs block feelings of anxiety and panic. However, prolonged use can lead to physiological and psychological dependence and can cause withdrawal symptoms when discontinued.  Beta-blockers. These medicines can keep you from getting excited. They have an effect on the nerve cells that cause that feeling. The drugs help you feel less tense and anxious. HOME CARE INSTRUCTIONS  Take medicine as directed by your caregiver. Follow the directions carefully. Make sure your caregiver knows about all other medicines you take. Do not start any new medicine unless your caregiver says it is okay. Do not take more medicine than is prescribed. Alert your caregiver if you plan to discontinue a medicine that he or she has prescribed to you for anxiety.  Try not to avoid fearful situations. Avoidance can increase your fears and could lead to a fear of leaving your home.  Learn relaxation techniques. Consider taking a class in meditation or deep breathing to help you stay calm.  Consider joining a support group. Ask your caregiver for a list of groups in your area.  Do  not drink alcohol or use drugs, especially if you are taking medicine for anxiety. SEEK MEDICAL CARE IF:  You are missing major life activities, such as school or work, because of your fears.  You have symptoms of depression, such as  depressed mood, loss of interest, or negative feelings about yourself. SEEK IMMEDIATE MEDICAL CARE IF:   You cannot leave your home.  You have trouble breathing.  You have chest pain.  You think about hurting yourself or someone else. FOR MORE INFORMATION Anxiety Disorders Association of America: www.adaa.org Document Released: 02/28/2011 Document Revised: 04/08/2012 Document Reviewed: 02/28/2011 [facility] Patient Information 2015 [place], [place]. This information is not intended to replace advice given to you by your health care provider. Make sure you discuss any questions you have with your health care provider. Panic Attacks Panic attacks are sudden, short-livedsurges of severe anxiety, fear, or discomfort. They may occur for no reason when you are relaxed, when you are anxious, or when you are sleeping. Panic attacks may occur for a number of reasons:   Healthy people occasionally have panic attacks in extreme, life-threatening situations, such as war or natural disasters. Normal anxiety is a protective mechanism of the body that helps us react to danger (fight or flight response).  Panic attacks are often seen with anxiety disorders, such as panic disorder, social anxiety disorder, generalized anxiety disorder, and phobias. Anxiety disorders cause excessive or uncontrollable anxiety. They may interfere with your relationships or other life activities.  Panic attacks are sometimes seen with other mental illnesses, such as depression and posttraumatic stress disorder.  Certain medical conditions, prescription medicines, and drugs of abuse can cause panic attacks. SYMPTOMS  Panic attacks start suddenly, peak within 20 minutes, and are accompanied by four or more of the following symptoms:  Pounding heart or fast heart rate (palpitations).  Sweating.  Trembling or shaking.  Shortness of breath or feeling smothered.  Feeling choked.  Chest pain or discomfort.  Nausea or  strange feeling in your stomach.  Dizziness, light-headedness, or feeling like you will faint.  Chills or hot flushes.  Numbness or tingling in your lips or hands and feet.  Feeling that things are not real or feeling that you are not yourself.  Fear of losing control or going crazy.  Fear of dying. Some of these symptoms can mimic serious medical conditions. For example, you may think you are having a heart attack. Although panic attacks can be very scary, they are not life threatening. DIAGNOSIS  Panic attacks are diagnosed through an assessment by your health care provider. Your health care provider will ask questions about your symptoms, such as where and when they occurred. Your health care provider will also ask about your medical history and use of alcohol and drugs, including prescription medicines. Your health care provider may order blood tests or other studies to rule out a serious medical condition. Your health care provider may refer you to a mental health professional for further evaluation. TREATMENT   Most healthy people who have one or two panic attacks in an extreme, life-threatening situation will not require treatment.  The treatment for panic attacks associated with anxiety disorders or other mental illness typically involves counseling with a mental health professional, medicine, or a combination of both. Your health care provider will help determine what treatment is best for you.  Panic attacks due to physical illness usually go away with treatment of the illness. If prescription medicine is causing panic attacks, talk with your health care  provider about stopping the medicine, decreasing the dose, or substituting another medicine.  Panic attacks due to alcohol or drug abuse go away with abstinence. Some adults need professional help in order to stop drinking or using drugs. HOME CARE INSTRUCTIONS   Take all medicines as directed by your health care provider.    Schedule and attend follow-up visits as directed by your health care provider. It is important to keep all your appointments. SEEK MEDICAL CARE IF:  You are not able to take your medicines as prescribed.  Your symptoms do not improve or get worse. SEEK IMMEDIATE MEDICAL CARE IF:   You experience panic attack symptoms that are different than your usual symptoms.  You have serious thoughts about hurting yourself or others.  You are taking medicine for panic attacks and have a serious side effect. MAKE SURE YOU:  Understand these instructions.  Will watch your condition.  Will get help right away if you are not doing well or get worse. Document Released: 10/08/2005 Document Revised: 10/13/2013 Document Reviewed: 05/22/2013 [facility] Patient Information 2015 [place], [place]. This information is not intended to replace advice given to you by your health care provider. Make sure you discuss any questions you have with your health care provider.

## 2016-08-26 ENCOUNTER — Emergency Department: Admission: EM | Admit: 2016-08-26 | Discharge: 2016-08-26 | Discharge status: Absent without leave | Payer: Self-pay

## 2017-01-01 ENCOUNTER — Emergency Department (HOSPITAL_COMMUNITY)
Admission: EM | Admit: 2017-01-01 | Discharge: 2017-01-02 | Disposition: A | Discharge status: Other Acute Inpatient Hospital | Payer: BLUE CROSS/BLUE SHIELD | Attending: Emergency Medicine | Admitting: Emergency Medicine

## 2017-01-01 ENCOUNTER — Encounter (HOSPITAL_COMMUNITY): Payer: Self-pay | Admitting: Emergency Medicine

## 2017-01-01 DIAGNOSIS — R45851 Suicidal ideations: Secondary | ICD-10-CM

## 2017-01-01 DIAGNOSIS — Z818 Family history of other mental and behavioral disorders: Secondary | ICD-10-CM | POA: Diagnosis not present

## 2017-01-01 DIAGNOSIS — F17210 Nicotine dependence, cigarettes, uncomplicated: Secondary | ICD-10-CM | POA: Diagnosis not present

## 2017-01-01 DIAGNOSIS — F19929 Other psychoactive substance use, unspecified with intoxication, unspecified: Secondary | ICD-10-CM

## 2017-01-01 DIAGNOSIS — F332 Major depressive disorder, recurrent severe without psychotic features: Secondary | ICD-10-CM | POA: Diagnosis not present

## 2017-01-01 DIAGNOSIS — F1920 Other psychoactive substance dependence, uncomplicated: Secondary | ICD-10-CM | POA: Diagnosis present

## 2017-01-01 DIAGNOSIS — F19151 Other psychoactive substance abuse with psychoactive substance-induced psychotic disorder with hallucinations: Secondary | ICD-10-CM | POA: Insufficient documentation

## 2017-01-01 DIAGNOSIS — R5383 Other fatigue: Secondary | ICD-10-CM | POA: Diagnosis present

## 2017-01-01 DIAGNOSIS — R443 Hallucinations, unspecified: Secondary | ICD-10-CM | POA: Diagnosis not present

## 2017-01-01 DIAGNOSIS — F19951 Other psychoactive substance use, unspecified with psychoactive substance-induced psychotic disorder with hallucinations: Secondary | ICD-10-CM

## 2017-01-01 DIAGNOSIS — F4001 Agoraphobia with panic disorder: Secondary | ICD-10-CM | POA: Diagnosis not present

## 2017-01-01 DIAGNOSIS — F19251 Other psychoactive substance dependence with psychoactive substance-induced psychotic disorder with hallucinations: Secondary | ICD-10-CM | POA: Diagnosis not present

## 2017-01-01 HISTORY — DX: Other psychoactive substance abuse, uncomplicated: F19.10

## 2017-01-01 LAB — COMPREHENSIVE METABOLIC PANEL
ALBUMIN: 3.9 g/dL (ref 3.5–5.0)
ALT: 22 U/L (ref 17–63)
ANION GAP: 11 (ref 5–15)
AST: 25 U/L (ref 15–41)
Alkaline Phosphatase: 58 U/L (ref 38–126)
BILIRUBIN TOTAL: 0.9 mg/dL (ref 0.3–1.2)
CHLORIDE: 107 mmol/L (ref 101–111)
CO2: 22 mmol/L (ref 22–32)
Calcium: 8.8 mg/dL — ABNORMAL LOW (ref 8.9–10.3)
Creatinine, Ser: 0.89 mg/dL (ref 0.61–1.24)
GFR calc Af Amer: >60 mL/min (ref >60)
GFR calc non Af Amer: >60 mL/min (ref >60)
GLUCOSE: 81 mg/dL (ref 65–99)
POTASSIUM: 3.8 mmol/L (ref 3.5–5.1)
SODIUM: 140 mmol/L (ref 135–145)
TOTAL PROTEIN: 7.4 g/dL (ref 6.5–8.1)

## 2017-01-01 LAB — CBC
HEMATOCRIT: 38 % — AB (ref 39.0–52.0)
HEMOGLOBIN: 13.7 g/dL (ref 13.0–17.0)
MCH: 30.2 pg (ref 26.0–34.0)
MCHC: 36.1 g/dL — ABNORMAL HIGH (ref 30.0–36.0)
MCV: 83.9 fL (ref 78.0–100.0)
Platelets: 401 10*3/uL — ABNORMAL HIGH (ref 150–400)
RBC: 4.53 MIL/uL (ref 4.22–5.81)
RDW: 13.9 % (ref 11.5–15.5)
WBC: 11 10*3/uL — AB (ref 4.0–10.5)

## 2017-01-01 LAB — RAPID URINE DRUG SCREEN, HOSP PERFORMED
AMPHETAMINES: NOT DETECTED
BARBITURATES: NOT DETECTED
BENZODIAZEPINES: POSITIVE — AB
COCAINE: POSITIVE — AB
Opiates: NOT DETECTED
TETRAHYDROCANNABINOL: POSITIVE — AB

## 2017-01-01 LAB — SALICYLATE LEVEL: Salicylate Lvl: <7 mg/dL (ref 2.8–30.0)

## 2017-01-01 LAB — ETHANOL: Alcohol, Ethyl (B): 129 mg/dL — ABNORMAL HIGH (ref <5)

## 2017-01-01 LAB — ACETAMINOPHEN LEVEL

## 2017-01-01 MED ORDER — NICOTINE 21 MG/24HR TD PT24
21.0000 mg | MEDICATED_PATCH | Freq: Every day | TRANSDERMAL | Status: DC
Start: 2017-01-01 — End: 2017-01-02
  Administered 2017-01-01: 21 mg via TRANSDERMAL
  Filled 2017-01-01 (×2): qty 1

## 2017-01-01 MED ORDER — THIAMINE HCL 100 MG/ML IJ SOLN
100.0000 mg | Freq: Every day | INTRAMUSCULAR | Status: DC
  Filled 2017-01-01: qty 2

## 2017-01-01 MED ORDER — ALUM & MAG HYDROXIDE-SIMETH 200-200-20 MG/5ML PO SUSP: 30.0000 mL | ORAL | Status: DC | PRN

## 2017-01-01 MED ORDER — LORAZEPAM 1 MG PO TABS: 0.0000 mg | ORAL_TABLET | Freq: Four times a day (QID) | ORAL | Status: DC

## 2017-01-01 MED ORDER — LORAZEPAM 1 MG PO TABS: 0.0000 mg | ORAL_TABLET | Freq: Two times a day (BID) | ORAL | Status: DC

## 2017-01-01 MED ORDER — VITAMIN B-1 100 MG PO TABS
100.0000 mg | ORAL_TABLET | Freq: Every day | ORAL | Status: DC
  Administered 2017-01-01 – 2017-01-02 (×2): 100 mg via ORAL
  Filled 2017-01-01 (×2): qty 1

## 2017-01-01 MED ORDER — LORAZEPAM 1 MG PO TABS
1.0000 mg | ORAL_TABLET | Freq: Three times a day (TID) | ORAL | Status: DC | PRN
  Administered 2017-01-01: 1 mg via ORAL
  Filled 2017-01-01: qty 1

## 2017-01-01 MED ORDER — ONDANSETRON HCL 4 MG PO TABS: 4.0000 mg | ORAL_TABLET | Freq: Three times a day (TID) | ORAL | Status: DC | PRN

## 2017-01-01 MED ORDER — ACETAMINOPHEN 325 MG PO TABS: 650.0000 mg | ORAL_TABLET | ORAL | Status: DC | PRN
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
## 2017-01-01 NOTE — BH Assessment (Signed)
Case discussed with Donell SievertSpencer Simon, PA and Shaune Pollackameron Isaacs, MD who are in agreement that the pt receive inpt treatment.   Roy BruinsAquicha Duff, MSW, LCSWA TTS Specialist [phone]

## 2017-01-01 NOTE — ED Notes (Signed)
Money inventoried in locker:  1 20.00 bill 1 10.00 bill 2 5.00 bill 5 1.00 bill  4 quarters 0.36 cent in extra change    Verified with WilderSterling, NT

## 2017-01-01 NOTE — ED Notes (Signed)
Pt is c/o of anxiety and withdrawl symptoms requesting medication for anxiety, Dr. Orpah ClintonIssac made aware.

## 2017-01-01 NOTE — ED Notes (Signed)
Pt refuses that any care be discussed with his aunt Byrd HesselbachMaria.  Also refuses any visitation from her.

## 2017-01-01 NOTE — ED Notes (Signed)
Bedside reporting done with Elaine 
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
## 2017-01-01 NOTE — ED Triage Notes (Signed)
Pt comes from home IVC'd by his aunt. Aunt states he is a multi substance abuser and reportedly has overdosed twice in the past few days.  Was recently hospitalized for same. Aunt also reports patient has been having auditory hallucinations telling him to hang himself.  Pt ambulatory on arrival.

## 2017-01-02 ENCOUNTER — Encounter (HOSPITAL_COMMUNITY): Payer: Self-pay | Admitting: *Deleted

## 2017-01-02 ENCOUNTER — Inpatient Hospital Stay (HOSPITAL_COMMUNITY)
Admission: AD | Admit: 2017-01-02 | Discharge: 2017-01-07 | DRG: 885 | Disposition: A | Discharge status: Home / Self Care | Payer: BLUE CROSS/BLUE SHIELD | Attending: Psychiatry | Admitting: Psychiatry

## 2017-01-02 DIAGNOSIS — F1120 Opioid dependence, uncomplicated: Secondary | ICD-10-CM | POA: Diagnosis present

## 2017-01-02 DIAGNOSIS — Z833 Family history of diabetes mellitus: Secondary | ICD-10-CM | POA: Diagnosis not present

## 2017-01-02 DIAGNOSIS — Z818 Family history of other mental and behavioral disorders: Secondary | ICD-10-CM

## 2017-01-02 DIAGNOSIS — F1190 Opioid use, unspecified, uncomplicated: Secondary | ICD-10-CM | POA: Diagnosis not present

## 2017-01-02 DIAGNOSIS — R45851 Suicidal ideations: Secondary | ICD-10-CM | POA: Diagnosis present

## 2017-01-02 DIAGNOSIS — F1490 Cocaine use, unspecified, uncomplicated: Secondary | ICD-10-CM | POA: Diagnosis not present

## 2017-01-02 DIAGNOSIS — F4001 Agoraphobia with panic disorder: Secondary | ICD-10-CM | POA: Diagnosis not present

## 2017-01-02 DIAGNOSIS — Z79899 Other long term (current) drug therapy: Secondary | ICD-10-CM | POA: Diagnosis not present

## 2017-01-02 DIAGNOSIS — Z915 Personal history of self-harm: Secondary | ICD-10-CM

## 2017-01-02 DIAGNOSIS — F19251 Other psychoactive substance dependence with psychoactive substance-induced psychotic disorder with hallucinations: Secondary | ICD-10-CM | POA: Diagnosis present

## 2017-01-02 DIAGNOSIS — F332 Major depressive disorder, recurrent severe without psychotic features: Principal | ICD-10-CM | POA: Diagnosis present

## 2017-01-02 DIAGNOSIS — F1920 Other psychoactive substance dependence, uncomplicated: Secondary | ICD-10-CM | POA: Diagnosis present

## 2017-01-02 DIAGNOSIS — F419 Anxiety disorder, unspecified: Secondary | ICD-10-CM | POA: Diagnosis present

## 2017-01-02 DIAGNOSIS — F17210 Nicotine dependence, cigarettes, uncomplicated: Secondary | ICD-10-CM | POA: Diagnosis present

## 2017-01-02 DIAGNOSIS — F1099 Alcohol use, unspecified with unspecified alcohol-induced disorder: Secondary | ICD-10-CM | POA: Diagnosis not present

## 2017-01-02 DIAGNOSIS — F10239 Alcohol dependence with withdrawal, unspecified: Secondary | ICD-10-CM | POA: Diagnosis present

## 2017-01-02 DIAGNOSIS — F19951 Other psychoactive substance use, unspecified with psychoactive substance-induced psychotic disorder with hallucinations: Secondary | ICD-10-CM

## 2017-01-02 DIAGNOSIS — F19929 Other psychoactive substance use, unspecified with intoxication, unspecified: Secondary | ICD-10-CM | POA: Diagnosis present

## 2017-01-02 MED ORDER — RISPERIDONE 0.5 MG PO TABS
0.5000 mg | ORAL_TABLET | Freq: Two times a day (BID) | ORAL | Status: DC
  Administered 2017-01-02 – 2017-01-07 (×10): 0.5 mg via ORAL
  Filled 2017-01-02 (×16): qty 1

## 2017-01-02 MED ORDER — LORAZEPAM 1 MG PO TABS
0.0000 mg | ORAL_TABLET | Freq: Four times a day (QID) | ORAL | Status: DC
  Administered 2017-01-02: 1 mg via ORAL
  Filled 2017-01-02: qty 1

## 2017-01-02 MED ORDER — GABAPENTIN 100 MG PO CAPS
200.0000 mg | ORAL_CAPSULE | Freq: Three times a day (TID) | ORAL | Status: DC
  Administered 2017-01-02: 200 mg via ORAL
  Filled 2017-01-02: qty 2

## 2017-01-02 MED ORDER — NICOTINE 21 MG/24HR TD PT24
21.0000 mg | MEDICATED_PATCH | Freq: Every day | TRANSDERMAL | Status: DC
  Administered 2017-01-03 – 2017-01-07 (×4): 21 mg via TRANSDERMAL
  Filled 2017-01-02 (×7): qty 1

## 2017-01-02 MED ORDER — ALUM & MAG HYDROXIDE-SIMETH 200-200-20 MG/5ML PO SUSP
30.0000 mL | ORAL | Status: DC | PRN
Start: 2017-01-02 — End: 2017-01-07

## 2017-01-02 MED ORDER — ACETAMINOPHEN 325 MG PO TABS
650.0000 mg | ORAL_TABLET | ORAL | Status: DC | PRN
  Administered 2017-01-04 – 2017-01-05 (×4): 650 mg via ORAL
  Filled 2017-01-02 (×4): qty 2

## 2017-01-02 MED ORDER — THIAMINE HCL 100 MG/ML IJ SOLN: 100.0000 mg | Freq: Every day | INTRAMUSCULAR | Status: DC

## 2017-01-02 MED ORDER — RISPERIDONE 0.5 MG PO TABS
0.5000 mg | ORAL_TABLET | Freq: Two times a day (BID) | ORAL | Status: DC
  Administered 2017-01-02: 0.5 mg via ORAL
  Filled 2017-01-02: qty 1

## 2017-01-02 MED ORDER — GABAPENTIN 100 MG PO CAPS
200.0000 mg | ORAL_CAPSULE | Freq: Three times a day (TID) | ORAL | Status: DC
  Administered 2017-01-02 – 2017-01-03 (×3): 200 mg via ORAL
  Filled 2017-01-02 (×9): qty 2

## 2017-01-02 MED ORDER — LORAZEPAM 1 MG PO TABS: 0.0000 mg | ORAL_TABLET | Freq: Two times a day (BID) | ORAL | Status: DC

## 2017-01-02 MED ORDER — ONDANSETRON HCL 4 MG PO TABS: 4.0000 mg | ORAL_TABLET | Freq: Three times a day (TID) | ORAL | Status: DC | PRN

## 2017-01-02 MED ORDER — MAGNESIUM HYDROXIDE 400 MG/5ML PO SUSP: 30.0000 mL | Freq: Every day | ORAL | Status: DC | PRN

## 2017-01-02 MED ORDER — VITAMIN B-1 100 MG PO TABS
100.0000 mg | ORAL_TABLET | Freq: Every day | ORAL | Status: DC
  Administered 2017-01-03: 100 mg via ORAL
  Filled 2017-01-02 (×2): qty 1
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
## 2017-01-02 NOTE — Consult Note (Signed)
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 Polysubstance (excluding opioids) dependence w/o physiol dependence ([place]) [F19.20] 01/02/2017    Priority: High  . 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 male patient admitted with psychosis and suicide.  HPI:  Patient with history of Polysubstance dependence, Anxiety who was brought to [facility] under IVC initiated by his aunt. According to the IVC, the pt has been using cocaine, heroin, xanax, and alcohol. Overdosed 2 months ago, in ICU for several days. Patient reportedly called his mother yesterday and told her he was hearing voices telling him to hang himself.  Today, patient remains, anxious, suicidal and psychotic. He states that hr has not slept for the past few days because he was  binging on drugs. Patient is unable to contract for safety.    Past Psychiatric History: as above  Risk to Self: Suicidal Ideation: Yes-Currently Present Suicidal Intent: Yes-Currently Present Is patient at risk for suicide?: Yes Suicidal Plan?: Yes-Currently Present Specify Current Suicidal Plan: per IVC, pt's aunt reports the pt had a plan to hang himself in a motel room or OD on drugs  Access to Means: Yes Specify Access to Suicidal Means: pt has access to drugs and to items that could be used to hang himself  What has been your use of drugs/alcohol within the last 12 months?: pt denies, labs are positive and aunt  reports the pt has been using drugs in an attempt to OD  How many times?: 1 Triggers for Past Attempts: Unpredictable Intentional Self Injurious Behavior: None Risk to Others: Homicidal Ideation: No Thoughts of Harm to Others: No Current Homicidal Intent: No Current Homicidal Plan: No Access to Homicidal Means: No History of harm to others?: No Assessment of Violence: None Noted Does patient have access to weapons?: No Criminal Charges Pending?: No Does patient have a court date: No Prior Inpatient Therapy: Prior Inpatient Therapy: No Prior Outpatient Therapy: Prior Outpatient Therapy: No Does patient have an ACCT team?: No Does patient have Intensive In-House Services?  : No Does patient have [facility] services? : No Does patient have P4CC services?: No  Past Medical History:  Past Medical History:  Diagnosis Date  . Anxiety   . Depression   . ETOH abuse   . Substance abuse     Past Surgical History:  Procedure Laterality Date  . none     Family History:  Family History  Problem Relation Age of Onset  . Mental illness Other   . Diabetes Other   . Early death Other    Family Psychiatric  History:  Social History:  History  Alcohol Use  . Yes    Comment: sometimes daily     History  Drug Use    Comment: multi    Social History   Social History  . Marital status: Single    Spouse name: N/A  . Number of children: N/A  . Years of education: 10th Grade   Social History Main Topics  . Smoking status: Current Every Day Smoker  Packs/day: 0.50    Types: Cigarettes  . Smokeless tobacco: Never Used  . Alcohol use Yes     Comment: sometimes daily  . Drug use: Yes     Comment: multi  . Sexual activity: Not Asked   Other Topics Concern  . None   Social History Narrative  . None   Additional Social History:    Allergies:  No Known Allergies  Labs:  Results for orders placed or performed during the hospital encounter of 01/01/17 (from the past 48  hour(s))  Rapid urine drug screen (hospital performed)     Status: Abnormal   Collection Time: 01/01/17  6:00 PM  Result Value Ref Range   Opiates NONE DETECTED NONE DETECTED   Cocaine POSITIVE (A) NONE DETECTED   Benzodiazepines POSITIVE (A) NONE DETECTED   Amphetamines NONE DETECTED NONE DETECTED   Tetrahydrocannabinol POSITIVE (A) NONE DETECTED   Barbiturates NONE DETECTED NONE DETECTED    Comment:        DRUG SCREEN FOR MEDICAL PURPOSES ONLY.  IF CONFIRMATION IS NEEDED FOR ANY PURPOSE, NOTIFY LAB WITHIN 5 DAYS.        LOWEST DETECTABLE LIMITS FOR URINE DRUG SCREEN Drug Class       Cutoff (ng/mL) Amphetamine      1000 Barbiturate      200 Benzodiazepine   158 Tricyclics       309 Opiates          300 Cocaine          300 THC              50   Comprehensive metabolic panel     Status: Abnormal   Collection Time: 01/01/17  6:48 PM  Result Value Ref Range   Sodium 140 135 - 145 mmol/L   Potassium 3.8 3.5 - 5.1 mmol/L   Chloride 107 101 - 111 mmol/L   CO2 22 22 - 32 mmol/L   Glucose, Bld 81 65 - 99 mg/dL   BUN <5 (L) 6 - 20 mg/dL   Creatinine, Ser 0.89 0.61 - 1.24 mg/dL   Calcium 8.8 (L) 8.9 - 10.3 mg/dL   Total Protein 7.4 6.5 - 8.1 g/dL   Albumin 3.9 3.5 - 5.0 g/dL   AST 25 15 - 41 U/L   ALT 22 17 - 63 U/L   Alkaline Phosphatase 58 38 - 126 U/L   Total Bilirubin 0.9 0.3 - 1.2 mg/dL   GFR calc non Af Amer >60 >60 mL/min   GFR calc Af Amer >60 >60 mL/min    Comment: (NOTE) The eGFR has been calculated using the CKD EPI equation. This calculation has not been validated in all clinical situations. eGFR's persistently <60 mL/min signify possible Chronic Kidney Disease.    Anion gap 11 5 - 15  Ethanol     Status: Abnormal   Collection Time: 01/01/17  6:48 PM  Result Value Ref Range   Alcohol, Ethyl (B) 129 (H) <5 mg/dL    Comment:        LOWEST DETECTABLE LIMIT FOR SERUM ALCOHOL IS 5 mg/dL FOR MEDICAL PURPOSES ONLY   Salicylate level     Status: None    Collection Time: 01/01/17  6:48 PM  Result Value Ref Range   Salicylate Lvl <4.0 2.8 - 30.0 mg/dL  Acetaminophen level     Status: Abnormal   Collection Time: 01/01/17  6:48 PM  Result Value Ref Range   Acetaminophen (Tylenol), Serum <10 (L) 10 -  30 ug/mL    Comment:        THERAPEUTIC CONCENTRATIONS VARY SIGNIFICANTLY. A RANGE OF 10-30 ug/mL MAY BE AN EFFECTIVE CONCENTRATION FOR MANY PATIENTS. HOWEVER, SOME ARE BEST TREATED AT CONCENTRATIONS OUTSIDE THIS RANGE. ACETAMINOPHEN CONCENTRATIONS >150 ug/mL AT 4 HOURS AFTER INGESTION AND >50 ug/mL AT 12 HOURS AFTER INGESTION ARE OFTEN ASSOCIATED WITH TOXIC REACTIONS.   cbc     Status: Abnormal   Collection Time: 01/01/17  6:48 PM  Result Value Ref Range   WBC 11.0 (H) 4.0 - 10.5 K/uL   RBC 4.53 4.22 - 5.81 MIL/uL   Hemoglobin 13.7 13.0 - 17.0 g/dL   HCT 38.0 (L) 39.0 - 52.0 %   MCV 83.9 78.0 - 100.0 fL   MCH 30.2 26.0 - 34.0 pg   MCHC 36.1 (H) 30.0 - 36.0 g/dL   RDW 13.9 11.5 - 15.5 %   Platelets 401 (H) 150 - 400 K/uL    Current Facility-Administered Medications  Medication Dose Route Frequency Provider Last Rate Last Dose  . acetaminophen (TYLENOL) tablet 650 mg  650 mg Oral Q4H PRN Duffy Bruce, MD      . alum & mag hydroxide-simeth (MAALOX/MYLANTA) 200-200-20 MG/5ML suspension 30 mL  30 mL Oral PRN Duffy Bruce, MD      . gabapentin (NEURONTIN) capsule 200 mg  200 mg Oral TID Corena Pilgrim, MD      . LORazepam (ATIVAN) tablet 0-4 mg  0-4 mg Oral Q6H Duffy Bruce, MD       Followed by  . [START ON 01/04/2017] LORazepam (ATIVAN) tablet 0-4 mg  0-4 mg Oral Q12H Duffy Bruce, MD      . LORazepam (ATIVAN) tablet 1 mg  1 mg Oral Q8H PRN Duffy Bruce, MD   1 mg at 01/01/17 2156  . nicotine (NICODERM CQ - dosed in mg/24 hours) patch 21 mg  21 mg Transdermal Daily Duffy Bruce, MD   21 mg at 01/01/17 2328  . ondansetron (ZOFRAN) tablet 4 mg  4 mg Oral Q8H PRN Duffy Bruce, MD      . risperiDONE (RISPERDAL) tablet  0.5 mg  0.5 mg Oral BID Corena Pilgrim, MD      . thiamine (VITAMIN B-1) tablet 100 mg  100 mg Oral Daily Duffy Bruce, MD   100 mg at 01/01/17 2329   Or  . thiamine (B-1) injection 100 mg  100 mg Intravenous Daily Duffy Bruce, MD       No current outpatient prescriptions on file.    Musculoskeletal: Strength & Muscle Tone: within normal limits Gait & Station: normal Patient leans: N/A  Psychiatric Specialty Exam: Physical Exam  Psychiatric: His mood appears anxious. His affect is blunt. His speech is slurred. He is actively hallucinating. Cognition and memory are normal. He expresses impulsivity. He expresses suicidal ideation. He expresses suicidal plans.    Review of Systems  Constitutional: Positive for malaise/fatigue.  HENT: Negative.   Eyes: Negative.   Respiratory: Negative.   Cardiovascular: Negative.   Gastrointestinal: Negative.   Genitourinary: Negative.   Musculoskeletal: Positive for myalgias.  Skin: Negative.   Neurological: Negative.   Endo/Heme/Allergies: Negative.   Psychiatric/Behavioral: Positive for hallucinations, substance abuse and suicidal ideas. The patient is nervous/anxious.     Blood pressure 128/78, pulse 90, temperature 97.9 F (36.6 C), temperature source Oral, resp. rate 20, height 5' 9"  (1.753 m), weight 81.6 kg (180 lb), SpO2 96 %.Body mass index is 26.58 kg/m.  General Appearance: Disheveled  Eye Contact:  Minimal  Speech:  Slow and Slurred  Volume:  Decreased  Mood:  Anxious, Depressed, Dysphoric and Irritable  Affect:  Constricted  Thought Process:  Coherent  Orientation:  Full (Time, Place, and Person)  Thought Content:  Hallucinations: Auditory  Suicidal Thoughts:  Yes.  with intent/plan  Homicidal Thoughts:  No  Memory:  Immediate;   Fair Recent;   Fair Remote;   Fair  Judgement:  Poor  Insight:  Shallow  Psychomotor Activity:  Psychomotor Retardation  Concentration:  Concentration: Fair and Attention Span: Fair   Recall:  [employer] of Knowledge:  Fair  Language:  Good  Akathisia:  No  Handed:  Right  AIMS (if indicated):     Assets:  Communication Skills Social Support  ADL's:  Intact  Cognition:  WNL  Sleep:   poor     Treatment Plan Summary: Daily contact with patient to assess and evaluate symptoms and progress in treatment and Medication management  Continue Lorazepam detox protocol Start Gabapentin 200 mg tid for drugs withdrawal  Disposition: Recommend psychiatric Inpatient admission when medically cleared.  Corena Pilgrim, MD 01/02/2017 10:27 AM

## 2017-01-02 NOTE — ED Notes (Signed)
Pt discharged safely with [employer].  Pt was calm and cooperative at discharge.  All belongings were sent with patient. 

## 2017-01-02 NOTE — Progress Notes (Signed)
Pt did not attend group. 

## 2017-01-02 NOTE — BH Assessment (Signed)
[facility] Assessment Progress Note  Per Thedore MinsMojeed Akintayo, MD, this pt requires psychiatric hospitalization.  Roy Heinrichina Tate, RN, [facility]  Pt presents under IVC initiated by his aunt, and upheld by Dr Jannifer FranklinAkintayo, and [facility].  Pt's nurse, Kendal Hymendie, has been notified, and agrees to call report to [phone].  Pt is to be transported via [employer].   Roy Canninghomas Dearion Huot, MA Triage Specialist [phone]

## 2017-01-02 NOTE — [facility] Group Notes (Signed)
[facility] LCSW Group Therapy  01/02/2017 1:15pm  Type of Therapy: Group Therapy   Topic: Overcoming Obstacles  Participation Level: Pt invited. Did not attend.  Kailia Starry B Lameka Disla, MSW, LCSWA [phone]    

## 2017-01-02 NOTE — ED Notes (Signed)
Patient educated about search process and term "contraband " and routine search performed. No contraband found. 

## 2017-01-02 NOTE — Tx Team (Addendum)
Initial Treatment Plan 01/02/2017 4:10 PM Roy L Rogers VHQ:[id number]    PATIENT STRESSORS: Substance abuse   PATIENT STRENGTHS: Average or above average intelligence General fund of knowledge Physical Health Supportive family/friends   PATIENT IDENTIFIED PROBLEMS: Substance abuse  anxiety  "I need to not get so upset about things"  "I need to quit the drugs and alcohol"               DISCHARGE CRITERIA:  Ability to meet basic life and health needs Adequate post-discharge living arrangements Improved stabilization in mood, thinking, and/or behavior Medical problems require only outpatient monitoring Motivation to continue treatment in a less acute level of care Need for constant or close observation no longer present Reduction of life-threatening or endangering symptoms to within safe limits Safe-care adequate arrangements made Verbal commitment to aftercare and medication compliance Withdrawal symptoms are absent or subacute and managed without 24-hour nursing intervention  PRELIMINARY DISCHARGE PLAN: Attend aftercare/continuing care group Attend 12-step recovery group Outpatient therapy Return to previous living arrangement  PATIENT/FAMILY INVOLVEMENT: This treatment plan has been presented to and reviewed with the patient, Roy Rogers, and/or family member, .  The patient and family have been given the opportunity to ask questions and make suggestions.  Beatrix ShipperWright, Jan Martin, RN 01/02/2017, 4:10 PM

## 2017-01-02 NOTE — ED Notes (Signed)
Patient denies SI,HI and AVH at this time. Patient is calm and cooperative at this time. Encouragement and support provided and safety maintain. Plan of care discussed with patient. Q 15 min safety check in place and video monitoring.

## 2017-01-02 NOTE — Progress Notes (Signed)
D: Patient isolative to his room, and he remained in his bed this shift. Patient pleasant and cooperative with care, but does endorse depression with a dull, flat affect. Patient with no complaints. A: Encourage staff/peer interaction, medication compliance, and group participation. Administer medications as ordered, maintain Q 15 minute safety checks. R: Pt compliant with medications. Pt denies SI at this time and verbally contracts for safety. No signs/symptoms of distress noted.

## 2017-01-03 DIAGNOSIS — F1099 Alcohol use, unspecified with unspecified alcohol-induced disorder: Secondary | ICD-10-CM

## 2017-01-03 DIAGNOSIS — Z79899 Other long term (current) drug therapy: Secondary | ICD-10-CM

## 2017-01-03 DIAGNOSIS — Z818 Family history of other mental and behavioral disorders: Secondary | ICD-10-CM

## 2017-01-03 DIAGNOSIS — F332 Major depressive disorder, recurrent severe without psychotic features: Principal | ICD-10-CM

## 2017-01-03 DIAGNOSIS — F1190 Opioid use, unspecified, uncomplicated: Secondary | ICD-10-CM

## 2017-01-03 DIAGNOSIS — F1490 Cocaine use, unspecified, uncomplicated: Secondary | ICD-10-CM

## 2017-01-03 MED ORDER — LORAZEPAM 1 MG PO TABS
1.0000 mg | ORAL_TABLET | Freq: Every day | ORAL | Status: AC
  Administered 2017-01-06: 1 mg via ORAL
  Filled 2017-01-03: qty 1

## 2017-01-03 MED ORDER — LORAZEPAM 1 MG PO TABS
1.0000 mg | ORAL_TABLET | Freq: Four times a day (QID) | ORAL | Status: AC
  Administered 2017-01-03 (×3): 1 mg via ORAL
  Filled 2017-01-03 (×3): qty 1

## 2017-01-03 MED ORDER — ADULT MULTIVITAMIN W/MINERALS CH
1.0000 | ORAL_TABLET | Freq: Every day | ORAL | Status: DC
  Administered 2017-01-03 – 2017-01-07 (×5): 1 via ORAL
  Filled 2017-01-03 (×9): qty 1

## 2017-01-03 MED ORDER — LORAZEPAM 1 MG PO TABS
1.0000 mg | ORAL_TABLET | Freq: Three times a day (TID) | ORAL | Status: AC
  Administered 2017-01-04 (×3): 1 mg via ORAL
  Filled 2017-01-03 (×3): qty 1

## 2017-01-03 MED ORDER — VITAMIN B-1 100 MG PO TABS
100.0000 mg | ORAL_TABLET | Freq: Every day | ORAL | Status: DC
  Administered 2017-01-04 – 2017-01-07 (×4): 100 mg via ORAL
  Filled 2017-01-03 (×6): qty 1

## 2017-01-03 MED ORDER — LOPERAMIDE HCL 2 MG PO CAPS: 2.0000 mg | ORAL_CAPSULE | ORAL | Status: AC | PRN

## 2017-01-03 MED ORDER — GABAPENTIN 400 MG PO CAPS
400.0000 mg | ORAL_CAPSULE | Freq: Three times a day (TID) | ORAL | Status: DC
  Administered 2017-01-03 – 2017-01-07 (×11): 400 mg via ORAL
  Filled 2017-01-03 (×17): qty 1

## 2017-01-03 MED ORDER — HYDROXYZINE HCL 25 MG PO TABS
25.0000 mg | ORAL_TABLET | Freq: Four times a day (QID) | ORAL | Status: AC | PRN
  Administered 2017-01-04 – 2017-01-05 (×3): 25 mg via ORAL
  Filled 2017-01-03 (×3): qty 1

## 2017-01-03 MED ORDER — LORAZEPAM 1 MG PO TABS
1.0000 mg | ORAL_TABLET | Freq: Four times a day (QID) | ORAL | Status: AC | PRN
  Administered 2017-01-05: 1 mg via ORAL
  Filled 2017-01-03: qty 1

## 2017-01-03 MED ORDER — LORAZEPAM 1 MG PO TABS
1.0000 mg | ORAL_TABLET | Freq: Two times a day (BID) | ORAL | Status: AC
  Administered 2017-01-05 (×2): 1 mg via ORAL
  Filled 2017-01-03 (×2): qty 1

## 2017-01-03 NOTE — Tx Team (Signed)
Interdisciplinary Treatment and Diagnostic Plan Update 01/03/2017 Time of Session: 9:30am  Roy Rogers  MRN: [id number]  Principal Diagnosis: Major depressive disorder, recurrent severe without psychotic features ([place])  Secondary Diagnoses: Active Problems:   Major depressive disorder, recurrent severe without psychotic features ([place])   Current Medications:  Current Facility-Administered Medications  Medication Dose Route Frequency Provider Last Rate Last Dose  . acetaminophen (TYLENOL) tablet 650 mg  650 mg Oral Q4H PRN Patrecia Pour, NP      . alum & mag hydroxide-simeth (MAALOX/MYLANTA) 200-200-20 MG/5ML suspension 30 mL  30 mL Oral PRN Patrecia Pour, NP      . gabapentin (NEURONTIN) capsule 200 mg  200 mg Oral TID Patrecia Pour, NP   200 mg at 01/03/17 0829  . LORazepam (ATIVAN) tablet 0-4 mg  0-4 mg Oral Q6H Patrecia Pour, NP   1 mg at 01/02/17 1710   Followed by  . [START ON 01/04/2017] LORazepam (ATIVAN) tablet 0-4 mg  0-4 mg Oral Q12H Patrecia Pour, NP      . magnesium hydroxide (MILK OF MAGNESIA) suspension 30 mL  30 mL Oral Daily PRN Patrecia Pour, NP      . nicotine (NICODERM CQ - dosed in mg/24 hours) patch 21 mg  21 mg Transdermal Daily Patrecia Pour, NP   21 mg at 01/03/17 0829  . ondansetron (ZOFRAN) tablet 4 mg  4 mg Oral Q8H PRN Patrecia Pour, NP      . risperiDONE (RISPERDAL) tablet 0.5 mg  0.5 mg Oral BID Patrecia Pour, NP   0.5 mg at 01/03/17 0829  . thiamine (VITAMIN B-1) tablet 100 mg  100 mg Oral Daily Patrecia Pour, NP   100 mg at 01/03/17 2035   Or  . thiamine (B-1) injection 100 mg  100 mg Intravenous Daily Patrecia Pour, NP        PTA Medications: No prescriptions prior to admission.    Treatment Modalities: Medication Management, Group therapy, Case management,  1 to 1 session with clinician, Psychoeducation, Recreational therapy.  Patient Stressors: Substance abuse Patient Strengths: Average or above average intelligence General  fund of knowledge Physical Health Supportive family/friends  Physician Treatment Plan for Primary Diagnosis: Major depressive disorder, recurrent severe without psychotic features ([place]) Long Term Goal(s): Improvement in symptoms so as ready for discharge Short Term Goals:    Medication Management: Evaluate patient's response, side effects, and tolerance of medication regimen.  Therapeutic Interventions: 1 to 1 sessions, Unit Group sessions and Medication administration.  Evaluation of Outcomes: Not Met  Physician Treatment Plan for Secondary Diagnosis: Active Problems:   Major depressive disorder, recurrent severe without psychotic features ([place])  Long Term Goal(s): Improvement in symptoms so as ready for discharge  Short Term Goals: Ability to identify changes in lifestyle to reduce recurrence of condition will improve Ability to verbalize feelings will improve Ability to disclose and discuss suicidal ideas Ability to demonstrate self-control will improve Ability to identify and develop effective coping behaviors will improve Ability to maintain clinical measurements within normal limits will improve Compliance with prescribed medications will improve Ability to identify triggers associated with substance abuse/mental health issues will improve Ability to identify changes in lifestyle to reduce recurrence of condition will improve Ability to verbalize feelings will improve Ability to disclose and discuss suicidal ideas Ability to demonstrate self-control will improve Ability to identify and develop effective coping behaviors will improve Ability to maintain clinical measurements within normal limits will  improve Compliance with prescribed medications will improve Ability to identify triggers associated with substance abuse/mental health issues will improve  Medication Management: Evaluate patient's response, side effects, and tolerance of medication regimen.  Therapeutic  Interventions: 1 to 1 sessions, Unit Group sessions and Medication administration.  Evaluation of Outcomes: Not Met  RN Treatment Plan for Primary Diagnosis: Major depressive disorder, recurrent severe without psychotic features ([place]) Long Term Goal(s): Knowledge of disease and therapeutic regimen to maintain health will improve  Short Term Goals: Ability to verbalize frustration and anger appropriately will improve, Ability to disclose and discuss suicidal ideas and Compliance with prescribed medications will improve  Medication Management: RN will administer medications as ordered by provider, will assess and evaluate patient's response and provide education to patient for prescribed medication. RN will report any adverse and/or side effects to prescribing provider.  Therapeutic Interventions: 1 on 1 counseling sessions, Psychoeducation, Medication administration, Evaluate responses to treatment, Monitor vital signs and CBGs as ordered, Perform/monitor CIWA, COWS, AIMS and Fall Risk screenings as ordered, Perform wound care treatments as ordered.  Evaluation of Outcomes: Not Met  LCSW Treatment Plan for Primary Diagnosis: Major depressive disorder, recurrent severe without psychotic features ([place]) Long Term Goal(s): Safe transition to appropriate next level of care at discharge, Engage patient in therapeutic group addressing interpersonal concerns. Short Term Goals: Engage patient in aftercare planning with referrals and resources, Facilitate acceptance of mental health diagnosis and concerns, Identify triggers associated with mental health/substance abuse issues and Increase skills for wellness and recovery  Therapeutic Interventions: Assess for all discharge needs, 1 to 1 time with Social worker, Explore available resources and support systems, Assess for adequacy in community support network, Educate family and significant other(s) on suicide prevention, Complete Psychosocial Assessment,  Interpersonal group therapy.  Evaluation of Outcomes: Not Met  Progress in Treatment: Attending groups: Pt is new to milieu, continuing to assess  Participating in groups: Pt is new to milieu, continuing to assess  Taking medication as prescribed: Yes, MD continues to assess for medication changes as needed Toleration medication: Yes, no side effects reported at this time Family/Significant other contact made: No, CSW assessing for appropriate contact Patient understands diagnosis: Continuing to assess Discussing patient identified problems/goals with staff: Yes Medical problems stabilized or resolved: Yes Denies suicidal/homicidal ideation:  Issues/concerns per patient self-inventory: None Other: N/A  New problem(s) identified: None identified at this time.   New Short Term/Long Term Goal(s): None identified at this time.   Discharge Plan or Barriers: CSW still assessing for appropriate plan.  Reason for Continuation of Hospitalization:  Depression Medication stabilization Suicidal ideation Withdrawal symptoms  Estimated Length of Stay: 3-5 days  Attendees: Patient: 01/03/2017 10:24 AM  Physician: Dr. Sanjuana Letters 01/03/2017 10:24 AM  Nursing: Maryann Conners, RN 01/03/2017 10:24 AM  RN Care Manager: Lars Pinks, RN 01/03/2017 10:24 AM  Social Worker: Maxie Better, LCSW; Matthew Saras, Latanya Presser 01/03/2017 10:24 AM  Recreational Therapist:  01/03/2017 10:24 AM  Other: Lindell Spar, NP; Samuel Jester, NP 01/03/2017 10:24 AM  Other:  01/03/2017 10:24 AM  Other: 01/03/2017 10:24 AM   Scribe for Treatment Team: Georga Kaufmann, MSW,LCSWA 01/03/2017 10:24 AM

## 2017-01-03 NOTE — H&P (Signed)
Psychiatric Admission Assessment Adult  Patient Identification: Roy Rogers MRN:  [id number] Date of Evaluation:  01/03/2017 Chief Complaint:  SUBSTANCE INDUCED MOOD DISORDER ALCOHOL USE DISORDER COCAINE USE DISORDER OPIOID USE DISORDER Principal Diagnosis: Major depressive disorder, recurrent severe without psychotic features ([facility]) Diagnosis:   Patient Active Problem List   Diagnosis Date Noted  . Major depressive disorder, recurrent severe without psychotic features ([facility]) [F33.2] 01/02/2017    Priority: High  . Polysubstance (excluding opioids) dependence w/o physiol dependence (H[facility]) [F19.20] 01/02/2017  . 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 male, initially presented to ED.  The report states that Christifer was IVC'd by his aunt.  Upon assessment, patient was pleasant, he was smiling.  He states that he was not suicidal, he just wanted to get high.  "I just relapsed."  He states he has not been to [facility] but he was hospitalized in [place].    He states that he has suffered though drug withdrawal in the past but vaguely recollects.  Patient appears to have poor insight.  He mentions that he has heard voices, command voices to hurt himself and he is unsure if the drugs made him hear the voices.  Patient forwarded little and covered his face under the blankets.  Associated Signs/Symptoms: Depression Symptoms:  depressed mood, hopelessness, (Hypo) Manic Symptoms:  Labiality of Mood, Anxiety Symptoms:  Excessive Worry, Psychotic Symptoms:  NA PTSD Symptoms: NA Total Time spent with patient: 30 minutes  Past Psychiatric History: see HPI  Is the patient at risk to self? Yes.    Has the patient been a risk to self in the past 6 months? Yes.    Has the patient been a risk to self within  the distant past? Yes.    Is the patient a risk to others? No.  Has the patient been a risk to others in the past 6 months? No.  Has the patient been a risk to others within the distant past? No.   Prior Inpatient Therapy:   Prior Outpatient Therapy:    Alcohol Screening: 1. How often do you have a drink containing alcohol?: 4 or more times a week 2. How many drinks containing alcohol do you have on a typical day when you are drinking?: 7, 8, or 9 3. How often do you have six or more drinks on one occasion?: Daily or almost daily Preliminary Score: 7 4. How often during the last year have you found that you were not able to stop drinking once you had started?: Weekly 5. How often during the last year have you failed to do what was normally expected from you becasue of drinking?: Weekly 6. How often during the last year have you needed a first drink in the morning to get yourself going after a heavy drinking session?: Monthly 7. How often during the last year have you had a feeling of guilt of remorse after drinking?: Less than monthly 8. How often during the last year have you been unable to remember what happened the night before because you had been drinking?: Less than monthly 9. Have you or someone else been injured as a result of your drinking?: No 10. Has a relative or friend or a doctor or another health worker been concerned about your drinking or suggested you cut down?: Yes, during the last year   Alcohol Use Disorder Identification Test Final Score (AUDIT): 25 Brief Intervention: Yes Substance Abuse History in the last 12 months:  Yes.   Consequences of Substance Abuse: NA Previous Psychotropic Medications: Yes  Psychological Evaluations: Yes  Past Medical History:  Past Medical History:  Diagnosis Date  . Anxiety   . Depression   . ETOH abuse   . Substance abuse     Past Surgical History:  Procedure Laterality Date  . none     Family History:  Family History  Problem  Relation Age of Onset  . Mental illness Other   . Diabetes Other   . Early death Other    Family Psychiatric  History: see HPI Tobacco Screening: Have you used any form of tobacco in the last 30 days? (Cigarettes, Smokeless Tobacco, Cigars, and/or Pipes): Yes Tobacco use, Select all that apply: 5 or more cigarettes per day Are you interested in Tobacco Cessation Medications?: Yes, will notify MD for an order Counseled patient on smoking cessation including recognizing danger situations, developing coping skills and basic information about quitting provided: Refused/Declined practical counseling Social History:  History  Alcohol Use  . Yes    Comment: sometimes daily     History  Drug Use  . Types: Cocaine, Benzodiazepines, Marijuana    Comment: multi    Additional Social History:      Longest period of sobriety (when/how long): 2 months                    Allergies:  No Known Allergies Lab Results:  Results for orders placed or performed during the hospital encounter of 01/01/17 (from the past 48 hour(s))  Rapid urine drug screen (hospital performed)     Status: Abnormal   Collection Time: 01/01/17  6:00 PM  Result Value Ref Range   Opiates NONE DETECTED NONE DETECTED   Cocaine POSITIVE (A) NONE DETECTED   Benzodiazepines POSITIVE (A) NONE DETECTED   Amphetamines NONE DETECTED NONE DETECTED   Tetrahydrocannabinol POSITIVE (A) NONE DETECTED   Barbiturates NONE DETECTED NONE DETECTED    Comment:        DRUG SCREEN FOR MEDICAL PURPOSES ONLY.  IF CONFIRMATION IS NEEDED FOR ANY PURPOSE, NOTIFY LAB WITHIN 5 DAYS.        LOWEST DETECTABLE LIMITS FOR URINE DRUG SCREEN Drug Class       Cutoff (ng/mL) Amphetamine      1000 Barbiturate      200 Benzodiazepine   200 Tricyclics       300 Opiates          300 Cocaine          300 THC              50   Comprehensive metabolic panel     Status: Abnormal   Collection Time: 01/01/17  6:48 PM  Result Value Ref Range    Sodium 140 135 - 145 mmol/L   Potassium 3.8 3.5 - 5.1 mmol/L   Chloride 107 101 - 111 mmol/L   CO2 22 22 - 32 mmol/L   Glucose, Bld 81 65 - 99 mg/dL   BUN <5 (L) 6 - 20 mg/dL   Creatinine, Ser 0.89 0.61 - 1.24 mg/dL   Calcium 8.8 (L) 8.9 - 10.3 mg/dL   Total Protein 7.4 6.5 - 8.1 g/dL   Albumin 3.9 3.5 - 5.0 g/dL   AST 25 15 - 41 U/L   ALT 22 17 - 63 U/L   Alkaline   Phosphatase 58 38 - 126 U/L   Total Bilirubin 0.9 0.3 - 1.2 mg/dL   GFR calc non Af Amer >60 >60 mL/min   GFR calc Af Amer >60 >60 mL/min    Comment: (NOTE) The eGFR has been calculated using the CKD EPI equation. This calculation has not been validated in all clinical situations. eGFR's persistently <60 mL/min signify possible Chronic Kidney Disease.    Anion gap 11 5 - 15  Ethanol     Status: Abnormal   Collection Time: 01/01/17  6:48 PM  Result Value Ref Range   Alcohol, Ethyl (B) 129 (H) <5 mg/dL    Comment:        LOWEST DETECTABLE LIMIT FOR SERUM ALCOHOL IS 5 mg/dL FOR MEDICAL PURPOSES ONLY   Salicylate level     Status: None   Collection Time: 01/01/17  6:48 PM  Result Value Ref Range   Salicylate Lvl <7.0 2.8 - 30.0 mg/dL  Acetaminophen level     Status: Abnormal   Collection Time: 01/01/17  6:48 PM  Result Value Ref Range   Acetaminophen (Tylenol), Serum <10 (L) 10 - 30 ug/mL    Comment:        THERAPEUTIC CONCENTRATIONS VARY SIGNIFICANTLY. A RANGE OF 10-30 ug/mL MAY BE AN EFFECTIVE CONCENTRATION FOR MANY PATIENTS. HOWEVER, SOME ARE BEST TREATED AT CONCENTRATIONS OUTSIDE THIS RANGE. ACETAMINOPHEN CONCENTRATIONS >150 ug/mL AT 4 HOURS AFTER INGESTION AND >50 ug/mL AT 12 HOURS AFTER INGESTION ARE OFTEN ASSOCIATED WITH TOXIC REACTIONS.   cbc     Status: Abnormal   Collection Time: 01/01/17  6:48 PM  Result Value Ref Range   WBC 11.0 (H) 4.0 - 10.5 K/uL   RBC 4.53 4.22 - 5.81 MIL/uL   Hemoglobin 13.7 13.0 - 17.0 g/dL   HCT 38.0 (L) 39.0 - 52.0 %   MCV 83.9 78.0 - 100.0 fL   MCH 30.2 26.0 -  34.0 pg   MCHC 36.1 (H) 30.0 - 36.0 g/dL   RDW 13.9 11.5 - 15.5 %   Platelets 401 (H) 150 - 400 K/uL    Blood Alcohol level:  Lab Results  Component Value Date   ETH 129 (H) 01/01/2017    Metabolic Disorder Labs:  No results found for: HGBA1C, MPG No results found for: PROLACTIN No results found for: CHOL, TRIG, HDL, CHOLHDL, VLDL, LDLCALC  Current Medications: Current Facility-Administered Medications  Medication Dose Route Frequency Provider Last Rate Last Dose  . acetaminophen (TYLENOL) tablet 650 mg  650 mg Oral Q4H PRN Jamison Y Lord, NP      . alum & mag hydroxide-simeth (MAALOX/MYLANTA) 200-200-20 MG/5ML suspension 30 mL  30 mL Oral PRN Jamison Y Lord, NP      . gabapentin (NEURONTIN) capsule 200 mg  200 mg Oral TID Jamison Y Lord, NP   200 mg at 01/03/17 1131  . hydrOXYzine (ATARAX/VISTARIL) tablet 25 mg  25 mg Oral Q6H PRN  , NP      . loperamide (IMODIUM) capsule 2-4 mg  2-4 mg Oral PRN  , NP      . LORazepam (ATIVAN) tablet 1 mg  1 mg Oral Q6H PRN  , NP      . LORazepam (ATIVAN) tablet 1 mg  1 mg Oral QID  , NP   1 mg at 01/03/17 1131   Followed by  . [START ON 01/04/2017] LORazepam (ATIVAN) tablet 1 mg  1 mg Oral TID  , NP       Followed by  . [  START ON 01/05/2017] LORazepam (ATIVAN) tablet 1 mg  1 mg Oral BID Kerrie Buffalo, NP       Followed by  . [START ON 01/06/2017] LORazepam (ATIVAN) tablet 1 mg  1 mg Oral Daily Kerrie Buffalo, NP      . magnesium hydroxide (MILK OF MAGNESIA) suspension 30 mL  30 mL Oral Daily PRN Patrecia Pour, NP      . multivitamin with minerals tablet 1 tablet  1 tablet Oral Daily Kerrie Buffalo, NP   1 tablet at 01/03/17 1131  . nicotine (NICODERM CQ - dosed in mg/24 hours) patch 21 mg  21 mg Transdermal Daily Patrecia Pour, NP   21 mg at 01/03/17 0829  . ondansetron (ZOFRAN) tablet 4 mg  4 mg Oral Q8H PRN Patrecia Pour, NP      . risperiDONE (RISPERDAL) tablet 0.5 mg  0.5 mg  Oral BID Patrecia Pour, NP   0.5 mg at 01/03/17 0829  . [START ON 01/04/2017] thiamine (VITAMIN B-1) tablet 100 mg  100 mg Oral Daily Kerrie Buffalo, NP       PTA Medications: No prescriptions prior to admission.    Musculoskeletal: Strength & Muscle Tone: within normal limits Gait & Station: normal Patient leans: N/A  Psychiatric Specialty Exam: Physical Exam  Nursing note and vitals reviewed.   ROS  Blood pressure 127/67, pulse 82, temperature 98.3 F (36.8 C), temperature source Oral, resp. rate 18, height 5' 9" (1.753 m), weight 81.6 kg (180 lb).Body mass index is 26.58 kg/m.  General Appearance: Casual  Eye Contact:  Fair  Speech:  Clear and Coherent  Volume:  Normal  Mood:  Angry and Anxious  Affect:  Constricted  Thought Process:  Coherent  Orientation:  Full (Time, Place, and Person)  Thought Content:  Logical  Suicidal Thoughts:  No  Homicidal Thoughts:  No  Memory:  Immediate;   Fair Recent;   Good Remote;   Fair  Judgement:  Fair  Insight:  Fair  Psychomotor Activity:  Normal  Concentration:  Concentration: Fair and Attention Span: Fair  Recall:  [employer] of Knowledge:  Fair  Language:  Fair  Akathisia:  No  Handed:  Right  AIMS (if indicated):     Assets:  Physical Health  ADL's:  Intact  Cognition:  WNL  Sleep:  Number of Hours: 6.75   Treatment Plan Summary: Admit for crisis management and mood stabilization. Medication management to re-stabilize current mood symptoms Group counseling sessions for coping skills Medical consults as needed Review and reinstate any pertinent home medications for other health problems  Observation Level/Precautions:  15 minute checks  Laboratory:  per ED  Psychotherapy:  group  Medications:  As per medlist  Consultations:  As needed  Discharge Concerns:  safety  Estimated LOS:  2- 7 days  Other:     Physician Treatment Plan for Primary Diagnosis: Major depressive disorder, recurrent severe without psychotic  features ([place]) Long Term Goal(s): Improvement in symptoms so as ready for discharge  Short Term Goals: Ability to identify changes in lifestyle to reduce recurrence of condition will improve, Ability to verbalize feelings will improve, Ability to disclose and discuss suicidal ideas, Ability to demonstrate self-control will improve, Ability to identify and develop effective coping behaviors will improve, Ability to maintain clinical measurements within normal limits will improve, Compliance with prescribed medications will improve and Ability to identify triggers associated with substance abuse/mental health issues will improve  Physician Treatment Plan for Secondary  Diagnosis: Principal Problem:   Major depressive disorder, recurrent severe without psychotic features ([place])  Long Term Goal(s): Improvement in symptoms so as ready for discharge  Short Term Goals: Ability to identify changes in lifestyle to reduce recurrence of condition will improve, Ability to verbalize feelings will improve, Ability to disclose and discuss suicidal ideas, Ability to demonstrate self-control will improve, Ability to identify and develop effective coping behaviors will improve, Ability to maintain clinical measurements within normal limits will improve, Compliance with prescribed medications will improve and Ability to identify triggers associated with substance abuse/mental health issues will improve  I certify that inpatient services furnished can reasonably be expected to improve the patient's condition.    [facility], NP [facility] 3/15/20181:38 PM

## 2017-01-03 NOTE — Progress Notes (Signed)
D: Patient remains isolative to his room.  He has flat, blunted affect; his speech is slow and soft.  He forwards little with minimal interaction with staff and peers.  He denies any thoughts of self harm.  He denies any depressive symptoms or thoughts of self harm.  Patient denies HI and does not appear to be responding to internal stimuli.  He rates his anxiety as 5.  Patient is disheveled and has flat, blunted affect.  He forward little and has minimal interaction with staff.  Patient is isolative to room, however, he is going to cafeteria for his meals. A: Continue to monitor medication management and MD orders.  Safety checks completed every 15 minutes per protocol.  Offer support and encouragement as needed. R: Patient is receptive to staff; his behavior is appropriate.

## 2017-01-03 NOTE — Progress Notes (Signed)
Pt on unit in dayroom.  Pt denies SI, HI and AVH.  Pt denies pain or discomfort.  Pt asks for toiletries for shower. Pt takes scheduled medications. Pt given all toiletries and comb for shower. Pt remains safe on unit.

## 2017-01-03 NOTE — [facility] Suicide Risk Assessment (Signed)
[facility] INPATIENT:  Family/Significant Other Suicide Prevention Education  Suicide Prevention Education:  Patient Refusal for Family/Significant Other Suicide Prevention Education: The patient Roy Rogers has refused to provide written consent for family/significant other to be provided Family/Significant Other Suicide Prevention Education during admission and/or prior to discharge.  Physician notified.  Roy Rogers 01/03/2017, 3:06 PM

## 2017-01-03 NOTE — B[facility] Counselor (Signed)
Adult Comprehensive Assessment  Patient ID: Roy Rogers, male   DOB: 10-Jul-1991, 26 y.o.   MRN: [id number]  Information Source: Information source: Patient  Current Stressors:  Educational / Learning stressors: None reported  Employment / Job issues: Pt is currently unemployed but does have a job interview this weekend  Family Relationships: None reported  Surveyor, quantityinancial / Lack of resources (include bankruptcy): Pt is financially supported by his mother  Housing / Lack of housing: None reported  Physical health (include injuries & life threatening diseases): None reported  Social relationships: None reported  Substance abuse: Cocaine and alcohol use  Bereavement / Loss: None reported   Living/Environment/Situation:  Living Arrangements: Parent Living conditions (as described by patient or guardian): [place] lives [place] his mother [place] How long has patient lived in current situation?: Pt has lived with his mother his whole life  What is atmosphere in current home: Comfortable, Loving  Family History:  Marital status: Single Does patient have children?: No  Childhood History:  By whom was/is the patient raised?: Both parents Additional childhood history information: "Amazing" Description of patient's relationship with caregiver when they were a child: Close relationship Patient's description of current relationship with people who raised him/her: Pt is still close with both of his parents although they are no longer together  Does patient have siblings?: Yes Number of Siblings: 6 (5 brothers and 1 sister ) Description of patient's current relationship with siblings: Close relationship, pt's siblings all live with him Did patient suffer any verbal/emotional/physical/sexual abuse as a child?: No Did patient suffer from severe childhood neglect?: No Has patient ever been sexually abused/assaulted/raped as an adolescent or adult?: No Was the patient ever a victim of a crime or a  disaster?: No Witnessed domestic violence?: No Has patient been effected by domestic violence as an adult?: No  Education:  Highest grade of school patient has completed: 10TH Currently a Consulting civil engineerstudent?: No Learning disability?: No  Employment/Work Situation:   Employment situation: Unemployed (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 Has patient ever been in the Eli Lilly and Companymilitary?: No Has patient ever served in combat?: No Did You Receive Any Psychiatric Treatment/Services While in Equities traderthe Military?: No Are There Guns or Other Weapons in Your Home?: No Are These ComptrollerWeapons Safely Secured?:  (NA)  Financial Resources:   Financial resources: Support from parents / caregiver  Alcohol/Substance Abuse:   What has been your use of drugs/alcohol within the last 12 months?: "Cocaine and alcohol use on the weekends" If attempted suicide, did drugs/alcohol play a role in this?: No Alcohol/Substance Abuse Treatment Hx: Past Tx, Inpatient If yes, describe treatment: [facility] about 3 mo ago Has alcohol/substance abuse ever caused legal problems?: Yes (Pt is currently on probation for possession with intent to sell cocaine )  Social Support System:   Patient's Community Support System: Production assistant, radioGood Describe Community Support System: Mom and other family members  Type of faith/religion: None  How does patient's faith help to cope with current illness?: NA  Leisure/Recreation:   Leisure and Hobbies: Paint   Strengths/Needs:   What things does the patient do well?: Painting, drawing  In what areas does patient struggle / problems for patient: Math   Discharge Plan:   Does patient have access to transportation?: Yes (Mom will pick pt up) Will patient be returning  to same living situation after discharge?: Yes Currently receiving community mental health  services: Yes (From Whom) ([facility]) Does patient have financial barriers related to discharge medications?: No (pt has insurance )  Summary/Recommendations:     Patient is a 26 yo male who presented to the hospital with polysubstance use and SI. Primary triggers for admission include increasing drug use and job concerns. Pt states that he just wanted somewhere to detox. During the assessment pt very pleasant and appropriate. Pt is agreeable to [facility] [place] for outpatient services. Pt's supports include his mother and a few other family members. Patient will benefit from crisis stabilization, medication evaluation, group therapy and pyschoeducation, in addition to case management for discharge planning. At discharge, it is recommended that pt remain compliant with the established discharge plan and continue treatment.  Jonathon Barlow, MSW, Theresia Majors  01/03/2017
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
## 2017-01-03 NOTE — [facility] Group Notes (Signed)
[facility] LCSW Group Therapy 01/03/2017 1:15pm  Type of Therapy: Group Therapy- Balance in Life  Participation Level: Pt invited. Did not attend.   Jonathon JordanLynn B Maurie Musco, MSW, LCSWA [phone] 01/03/2017 4:43 PM

## 2017-01-04 MED ORDER — NALTREXONE HCL 50 MG PO TABS
50.0000 mg | ORAL_TABLET | Freq: Every day | ORAL | Status: DC
  Administered 2017-01-04 – 2017-01-07 (×4): 50 mg via ORAL
  Filled 2017-01-04 (×7): qty 1

## 2017-01-04 NOTE — Progress Notes (Signed)
Recreation Therapy Notes  Date: 01/04/17 Time: 0930 Location: 300 Hall Dayroom  Group Topic: Stress Management  Goal Area(s) Addresses:  Patient will verbalize importance of using healthy stress management.  Patient will identify positive emotions associated with healthy stress management.   Intervention: Stress Management  Activity :  Meditation.  LRT introduced the stress management technique of meditation to patients.  LRT played a meditation from the Calm app to allow patients to engage in the technique.  Patients were to follow along as the meditation played to engage.      Education:  Stress Management, Discharge Planning.   Education Outcome: Acknowledges edcuation/In group clarification offered/Needs additional education  Clinical Observations/Feedback: Pt did not attend group.   Caroll RancherMarjette Alva Broxson, LRT/CTRS         Caroll RancherLindsay, Laurine Kuyper A 01/04/2017 12:22 PM

## 2017-01-04 NOTE — Progress Notes (Signed)
Patient attended AA group meeting.  
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
## 2017-01-04 NOTE — [facility] Group Notes (Signed)
[facility] LCSW Group Therapy 01/04/2017 1:15pm  Type of Therapy: Group Therapy- Feelings Around Relapse and Recovery  Participation Level: Active   Participation Quality:  Appropriate  Affect:  Appropriate  Cognitive: Alert and Oriented   Insight:  Developing   Engagement in Therapy: Developing/Improving and Engaged   Modes of Intervention: Clarification, Confrontation, Discussion, Education, Exploration, Limit-setting, Orientation, Problem-solving, Rapport Building, Dance movement psychotherapisteality Testing, Socialization and Support  Summary of Progress/Problems: The topic for today was feelings about relapse. The group discussed what relapse prevention is to them and identified triggers that they are on the path to relapse. Members also processed their feeling towards relapse and were able to relate to common experiences. Group also discussed coping skills that can be used for relapse prevention. Pt states that he needs to change the people that he spends his time with to help protect his recovery. Pt stated that he has positive people in his life and needs to spend more time with those people.   Therapeutic Modalities:   Cognitive Behavioral Therapy Solution-Focused Therapy Assertiveness Training Relapse Prevention Therapy    Roy JordanLynn B Aubrii Sharpless, MSW, Theresia MajorsLCSWA [phone] 01/04/2017 3:52 PM

## 2017-01-04 NOTE — Progress Notes (Signed)
Data. Patient denies SI/HI/AVH. Patient interacting well with staff and other patients. Patient affect is flat but brightens with interaction. Personal hygiene is poor and patient is disheveled. Patient spent much of this shift in his room in bed. On his self assessment patient reports 2/10 for depression, 0/10 for hopelessness and 6/10 for anxiety. His goal for today is, "To get out in the day room more." Action. Emotional support and encouragement offered. Education provided on medication, indications and side effect. Q 15 minute checks done for safety. Response. Safety on the unit maintained through 15 minute checks.  Medications taken as prescribed. Attended one group, but left early.

## 2017-01-04 NOTE — Progress Notes (Signed)
Adult Psychoeducational Group Note  Date:  01/04/2017 Time:  2:07 PM  Group Topic/Focus:  Recovery Goals:   The focus of this group is to identify appropriate goals for recovery and establish a plan to achieve them.  Participation Level:  Active  Participation Quality:  Appropriate  Affect:  Appropriate  Cognitive:  Alert  Insight: Appropriate  Engagement in Group:  Engaged  Modes of Intervention:  Education  Additional Comments:  none  Eliezer MccoyBrandon  Hildreth Orsak 01/04/2017, 2:07 PM

## 2017-01-04 NOTE — Plan of Care (Signed)
Problem: Safety: Goal: Ability to remain free from injury will improve Outcome: Progressing Patient has remained free of all injury this shift.
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
## 2017-01-05 NOTE — Progress Notes (Signed)
Data. Patient denies SI/HI/AVH. Patient interacting well with staff and other patients. His affect has been flat, but brightens with interaction. Patient has had no complaints this shift. His did not complete his self assessment even with staff encouragement. Action. Emotional support and encouragement offered. Education provided on medication, indications and side effect. Q 15 minute checks done for safety. Response. Safety on the unit maintained through 15 minute checks.  Medications taken as prescribed. Attended groups. Remained calm and appropriate through out shift.

## 2017-01-05 NOTE — Plan of Care (Signed)
Problem: Medication: Goal: Compliance with prescribed medication regimen will improve Outcome: Progressing Patient has been taking all of his medications as prescribed this shift.

## 2017-01-05 NOTE — Progress Notes (Signed)
Pt attended the evening AA speaker meeting. Pt was engaged and appropriate. Laetitia Schnepf C, NT 01/05/17 9:40 PM 
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
## 2017-01-05 NOTE — [facility] Group Notes (Signed)
[facility] LCSW Group Therapy Note  01/05/2017  and 10 - 11 AM  Type of Therapy and Topic:  Group Therapy: Avoiding Self-Sabotaging and Enabling Behaviors  Participation Level:  Did Not Attend; invited to participate yet did not despite overhead announcement and encouragement by staff   Summary of Progress/Problems:  The main focus of today's process group was for the patient to identify ways in which they have in the past sabotaged their own recovery. Motivational Interviewing was utilized to identify motivation they may have for wanting to change. The Stages of Change were explained using a handout, and patients identified where they currently are with regard to stages of change.    Roy Bernatherine C Emery Binz, LCSW

## 2017-01-06 MED ORDER — HYDROXYZINE HCL 25 MG PO TABS
25.0000 mg | ORAL_TABLET | Freq: Four times a day (QID) | ORAL | Status: DC | PRN
  Administered 2017-01-06: 25 mg via ORAL
  Filled 2017-01-06: qty 1

## 2017-01-06 MED ORDER — TRAZODONE HCL 50 MG PO TABS
50.0000 mg | ORAL_TABLET | Freq: Every evening | ORAL | Status: DC | PRN
Start: 2017-01-06 — End: 2017-01-07
  Administered 2017-01-06: 50 mg via ORAL
  Filled 2017-01-06: qty 1
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
## 2017-01-06 NOTE — [facility] Suicide Risk Assessment (Signed)
[facility] INPATIENT:  Family/Significant Other Suicide Prevention Education  Suicide Prevention Education:  Education Completed; Mother Silverio LayKelly Buell [phone]  has been identified by the patient as the family member/significant other with whom the patient will be residing, and identified as the person(s) who will aid the patient in the event of a mental health crisis (suicidal ideations/suicide attempt).  With written consent from the patient, the family member/significant other has been provided the following suicide prevention education, prior to the and/or following the discharge of the patient.  The suicide prevention education provided includes the following:  Suicide risk factors  Suicide prevention and interventions  National Suicide Hotline telephone number  [facility] assessment telephone number  [facility] Emergency Assistance 911  [facility] and/or Residential Mobile Crisis Unit telephone number  Request made of family/significant other to:  Remove weapons (e.g., guns, rifles, knives), all items previously/currently identified as safety concern.    Remove drugs/medications (over-the-counter, prescriptions, illicit drugs), all items previously/currently identified as a safety concern.  The family member/significant other verbalizes understanding of the suicide prevention education information provided.  The family member/significant other agrees to remove the items of safety concern listed above.  Mother states there are no weapons in the home.  She is scared he will die of accidental overdose, not that he will kill himself.  Carloyn JaegerMareida J Grossman-Orr 01/06/2017, 3:42 PM

## 2017-01-06 NOTE — Plan of Care (Signed)
Problem: Medication: Goal: Compliance with prescribed medication regimen will improve Outcome: Progressing Patient took medications as ordered.

## 2017-01-06 NOTE — Progress Notes (Signed)
Data. Patient denies SI/HI/AVH. Patient spent much of this shift in his room in bed. Did not go to breakfast. Did attend lunch and dinner.  Affect is irritable this shift and responses to questions minimal or one worded. On his slf assessment patient reports, 1/10 for depression, 0/10 for hopelessness and 2/10 for anxiety. His goal for today is, "Getting up with my case worker." Action. Emotional support and encouragement offered. Education provided on medication, indications and side effect. Q 15 minute checks done for safety. Response. Safety on the unit maintained through 15 minute checks.  Medications taken as prescribed. Attended groups. Remained calm and appropriate through out shift.

## 2017-01-06 NOTE — [facility] Group Notes (Signed)
[facility] Group Notes:  (Nursing/MHT/Case Management/Adjunct)  Date:  01/06/2017  Time:  2:01 PM  Type of Therapy:  Nurse Education  Participation Level:  Did Not Attend   Almira Barenny G Jaedin Trumbo 01/06/2017, 2:01 PM

## 2017-01-06 NOTE — Progress Notes (Signed)
Patient attended AA group meeting.  

## 2017-01-06 NOTE — [facility] Group Notes (Signed)
[facility] LCSW Group Therapy  01/06/2017 10 - 11 AM  Type of Therapy:  Group Therapy  Participation Level:  Limited  Participation Quality:  Drowsy  Affect:  Depressed and Flat  Cognitive:  Oriented  Insight:  Limited  Engagement in Therapy:  Limited  Modes of Intervention:  Activity, Discussion, Socialization and Support  Summary of Progress/Problems: The main focus of today's process group was to identify the patient's current support system and decide on other supports that can be put in place. An emphasis was placed on using counselor, doctor, therapy groups, 12-step groups, and problem-specific support groups to expand supports. There was also an extensive discussion about what constitutes a healthy support versus an unhealthy support. Patients had opportunity to participate in activity using visuals. Pt chose a visual to represent decompensation as 'being tied down' and improvement as '[facility] Patient processed some of his feelings related to lack of education and employment.   Carney Bernatherine C Harrill, LCSW

## 2017-01-06 NOTE — Progress Notes (Signed)
D.  Pt pleasant on approach, minimal interaction.  Pt denies complaints at this time but did request medication for sleep.  Pt is hopeful for discharge tomorrow.  Pt was positive for evening AA group, interacting appropriately with peers on the unit.  Pt denies SI/HI/hallucinations at this time.  [facility] [place] 3 [place].  A.  Support and encouragement offered, NP notified of request for sleep medication, new order received.  Medication given as ordered.  R.  Pt remains safe on the unit, will continue to monitor.
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
## 2017-01-07 MED ORDER — GABAPENTIN 400 MG PO CAPS: 400.0000 mg | ORAL_CAPSULE | Freq: Three times a day (TID) | ORAL | 0 refills | Status: AC

## 2017-01-07 MED ORDER — RISPERIDONE 0.5 MG PO TABS: 0.5000 mg | ORAL_TABLET | Freq: Two times a day (BID) | ORAL | 0 refills | Status: AC

## 2017-01-07 MED ORDER — TRAZODONE HCL 50 MG PO TABS: 50.0000 mg | ORAL_TABLET | Freq: Every evening | ORAL | 0 refills | Status: AC | PRN

## 2017-01-07 MED ORDER — NALTREXONE HCL 50 MG PO TABS: 50.0000 mg | ORAL_TABLET | Freq: Every day | ORAL | 0 refills | Status: AC

## 2017-01-07 NOTE — Tx Team (Signed)
Interdisciplinary Treatment and Diagnostic Plan Update 01/07/2017 Time of Session: 9:30am  Roy Rogers  MRN: [id number]  Principal Diagnosis: Major depressive disorder, recurrent severe without psychotic features (HCC)  Secondary Diagnoses: Principal Problem:   Major depressive disorder, recurrent severe without psychotic features (HCC)   Current Medications:  Current Facility-Administered Medications  Medication Dose Route Frequency Provider Last Rate Last Dose  . acetaminophen (TYLENOL) tablet 650 mg  650 mg Oral Q4H PRN Jamison Y Lord, NP   650 mg at 01/05/17 2113  . alum & mag hydroxide-simeth (MAALOX/MYLANTA) 200-200-20 MG/5ML suspension 30 mL  30 mL Oral PRN Jamison Y Lord, NP      . gabapentin (NEURONTIN) capsule 400 mg  400 mg Oral TID Vincent A Izediuno, MD   400 mg at 01/07/17 0810  . hydrOXYzine (ATARAX/VISTARIL) tablet 25 mg  25 mg Oral Q6H PRN Jason A Berry, NP   25 mg at 01/06/17 2141  . magnesium hydroxide (MILK OF MAGNESIA) suspension 30 mL  30 mL Oral Daily PRN Jamison Y Lord, NP      . multivitamin with minerals tablet 1 tablet  1 tablet Oral Daily Sheila Agustin, NP   1 tablet at 01/07/17 0810  . naltrexone (DEPADE) tablet 50 mg  50 mg Oral Daily Vincent A Izediuno, MD   50 mg at 01/07/17 0810  . nicotine (NICODERM CQ - dosed in mg/24 hours) patch 21 mg  21 mg Transdermal Daily Jamison Y Lord, NP   21 mg at 01/07/17 0810  . ondansetron (ZOFRAN) tablet 4 mg  4 mg Oral Q8H PRN Jamison Y Lord, NP      . risperiDONE (RISPERDAL) tablet 0.5 mg  0.5 mg Oral BID Jamison Y Lord, NP   0.5 mg at 01/07/17 0810  . thiamine (VITAMIN B-1) tablet 100 mg  100 mg Oral Daily Sheila Agustin, NP   100 mg at 01/07/17 0810  . traZODone (DESYREL) tablet 50 mg  50 mg Oral QHS PRN Jason A Berry, NP   50 mg at 01/06/17 2141    PTA Medications: No prescriptions prior to admission.    Treatment Modalities: Medication Management, Group therapy, Case management,  1 to 1 session with  clinician, Psychoeducation, Recreational therapy.  Patient Stressors: Substance abuse Patient Strengths: Average or above average intelligence General fund of knowledge Physical Health Supportive family/friends  Physician Treatment Plan for Primary Diagnosis: Major depressive disorder, recurrent severe without psychotic features (HCC) Long Term Goal(s): Improvement in symptoms so as ready for discharge Short Term Goals: Ability to identify changes in lifestyle to reduce recurrence of condition will improve Ability to verbalize feelings will improve Ability to disclose and discuss suicidal ideas Ability to demonstrate self-control will improve Ability to identify and develop effective coping behaviors will improve Ability to maintain clinical measurements within normal limits will improve Compliance with prescribed medications will improve Ability to identify triggers associated with substance abuse/mental health issues will improve Ability to identify changes in lifestyle to reduce recurrence of condition will improve Ability to verbalize feelings will improve Ability to disclose and discuss suicidal ideas Ability to demonstrate self-control will improve Ability to identify and develop effective coping behaviors will improve Ability to maintain clinical measurements within normal limits will improve Compliance with prescribed medications will improve Ability to identify triggers associated with substance abuse/mental health issues will improve  Medication Management: Evaluate patient's response, side effects, and tolerance of medication regimen.  Therapeutic Interventions: 1 to 1 sessions, Unit Group sessions and Medication administration.  Evaluation   of Outcomes: Met  Physician Treatment Plan for Secondary Diagnosis: Principal Problem:   Major depressive disorder, recurrent severe without psychotic features ([place])  Long Term Goal(s): Improvement in symptoms so as ready for  discharge  Short Term Goals: Ability to identify changes in lifestyle to reduce recurrence of condition will improve Ability to verbalize feelings will improve Ability to disclose and discuss suicidal ideas Ability to demonstrate self-control will improve Ability to identify and develop effective coping behaviors will improve Ability to maintain clinical measurements within normal limits will improve Compliance with prescribed medications will improve Ability to identify triggers associated with substance abuse/mental health issues will improve Ability to identify changes in lifestyle to reduce recurrence of condition will improve Ability to verbalize feelings will improve Ability to disclose and discuss suicidal ideas Ability to demonstrate self-control will improve Ability to identify and develop effective coping behaviors will improve Ability to maintain clinical measurements within normal limits will improve Compliance with prescribed medications will improve Ability to identify triggers associated with substance abuse/mental health issues will improve  Medication Management: Evaluate patient's response, side effects, and tolerance of medication regimen.  Therapeutic Interventions: 1 to 1 sessions, Unit Group sessions and Medication administration.  Evaluation of Outcomes: Met  RN Treatment Plan for Primary Diagnosis: Major depressive disorder, recurrent severe without psychotic features ([place]) Long Term Goal(s): Knowledge of disease and therapeutic regimen to maintain health will improve  Short Term Goals: Ability to verbalize frustration and anger appropriately will improve, Ability to disclose and discuss suicidal ideas and Compliance with prescribed medications will improve  Medication Management: RN will administer medications as ordered by provider, will assess and evaluate patient's response and provide education to patient for prescribed medication. RN will report any adverse  and/or side effects to prescribing provider.  Therapeutic Interventions: 1 on 1 counseling sessions, Psychoeducation, Medication administration, Evaluate responses to treatment, Monitor vital signs and CBGs as ordered, Perform/monitor CIWA, COWS, AIMS and Fall Risk screenings as ordered, Perform wound care treatments as ordered.  Evaluation of Outcomes: Met  LCSW Treatment Plan for Primary Diagnosis: Major depressive disorder, recurrent severe without psychotic features ([place]) Long Term Goal(s): Safe transition to appropriate next level of care at discharge, Engage patient in therapeutic group addressing interpersonal concerns. Short Term Goals: Engage patient in aftercare planning with referrals and resources, Facilitate acceptance of mental health diagnosis and concerns, Identify triggers associated with mental health/substance abuse issues and Increase skills for wellness and recovery  Therapeutic Interventions: Assess for all discharge needs, 1 to 1 time with Social worker, Explore available resources and support systems, Assess for adequacy in community support network, Educate family and significant other(s) on suicide prevention, Complete Psychosocial Assessment, Interpersonal group therapy.  Evaluation of Outcomes: Met  Progress in Treatment: Attending groups: Yes Participating in groups: Yes Taking medication as prescribed: Yes Toleration medication: Yes, no side effects reported at this time Family/Significant other contact made: SPE completed with pt's mother. Patient understands diagnosis: Continuing to assess Discussing patient identified problems/goals with staff: Yes Medical problems stabilized or resolved: Yes Denies suicidal/homicidal ideation: Yes, self report.  Issues/concerns per patient self-inventory: None Other: N/A  New problem(s) identified: None identified at this time.   New Short Term/Long Term Goal(s): None identified at this time.   Discharge Plan or  Barriers: Patient has 3:00PM appt with [facility].   Reason for Continuation of Hospitalization: none  Estimated Length of Stay: discharge today   Attendees: Patient: 01/07/2017 9:39 AM  Physician: Dr. Mallie Darting MD  01/07/2017 9:39 AM  Nursing: Parthenia Ames RN 01/07/2017 9:39 AM  RN Care Manager: Lars Pinks, RN 01/07/2017 9:39 AM  Social Worker: Maxie Better, LCSW 01/07/2017 9:39 AM  Recreational Therapist:  01/07/2017 9:39 AM  Other: Lindell Spar, NP; Ricky Ala NP 01/07/2017 9:39 AM  Other:  01/07/2017 9:39 AM  Other: 01/07/2017 9:39 AM   Scribe for Treatment Team:. Maxie Better, MSW, LCSW Clinical Social Worker 01/07/2017 9:43 AM

## 2017-01-07 NOTE — Progress Notes (Signed)
Discharge Note: Patient discharged home with family member.  Patient denied SI and HI.  Denied A/V hallucinations.  Suicide prevention information given and discussed with patient who stated he understood and had no questions.  Patient stated he received all his belongings, clothing, toiletries, misc items, prescriptions, valuables envelope, cigarettes, cell phone, belt, shoes, etc.  Patient stated [facility].  All required discharge information given to patient at discharge.

## 2017-01-07 NOTE — Progress Notes (Signed)
Recreation Therapy Notes  Date: 01/07/17 Time: 0930 Location: 300 Hall Dayroom  Group Topic: Stress Management  Goal Area(s) Addresses:  Patient will verbalize importance of using healthy stress management.  Patient will identify positive emotions associated with healthy stress management.   Behavioral Response: Engaged  Intervention: Stress Management  Activity :  Stress Meditation.  LRT introduced the stress management technique of meditation.  LRT played a meditation on stress management from the Calm app to engage in the meditation.  Patients were to follow along as the meditation was played to partake in the meditation.  Education:  Stress Management, Discharge Planning.   Education Outcome: Acknowledges edcuation/In group clarification offered/Needs additional education  Clinical Observations/Feedback: Pt attended group.    Miran Kautzman, LRT/CTRS         Ridhi Hoffert A 01/07/2017 11:30 AM 

## 2017-01-07 NOTE — Progress Notes (Signed)
  [facility] Discharge [facility] :  Will you be returning to the same living situation after discharge:  Yes,  home At discharge, do you have transportation home?: Yes,  family member Do you have the ability to pay for your medications: Yes,  BCBS insurance  Release of information consent forms completed and submitted to medical records by CSW.  Patient to Follow up at: Follow-up Information    [facility] Go on 01/07/2017.   Why:  3:00pm appointment for assessment to be started in services Contact information: [address], [place], [place] [address] Phone: [phone]          Next level of care provider [facility] Link:no  Safety Planning and Suicide Prevention discussed: Yes,  SPE completed with pt's mother.  Have you used any form of tobacco in the last 30 days? (Cigarettes, Smokeless Tobacco, Cigars, and/or Pipes): Yes  Has patient been referred to the Quitline?: Patient refused referral  Patient has been referred for addiction treatment: Yes  Tykeshia Tourangeau N Smart LCSW 01/07/2017, 9:38 AM

## 2017-01-07 NOTE — Progress Notes (Signed)
D:  Patient's self inventory sheet, patient sleeps good, no sleep medication given.  Good appetite, normal energy level, good concentration.  Rated depression 1, denied hopeless, anxiety 5.  Denied withdrawals.  Denied SI.  Denied physical problems,  Denied pain.  Goal is discharge.  Plans to talk to SW.  No discharge plans. A:  Medications administered per MD orders.  Emotional support and encouragement given patient. R:  Denied SI and HI, contracts for safety.  Denied A/V hallucinations.  Safety maintained with 15 minute checks. Patient stated he is anxious to leave.  Will take his discharge papers to his parole officer.

## 2017-01-07 NOTE — [facility] Suicide Risk Assessment (Signed)
[facility]   Principal Problem: Major depressive disorder, recurrent severe without psychotic features [facility](HCC) Discharge Diagnoses:  Patient Active Problem List   Diagnosis Date Noted  . Polysubstance (excluding opioids) dependence w/o physiol dependence (HCC) [F19.20] 01/02/2017  . 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 Major depressive disorder, recurrent severe without psychotic features (HCC) [F33.2] 01/02/2017  . 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 rate 16, height 5\' 9"  (1.753 m), weight 81.6 kg (180 lb).Body mass index is 26.58 kg/m.  General Appearance: Casual  Eye Contact::  Fair  Speech:  Clear and Coherent409  Volume:  Normal  Mood:  Euthymic  Affect:  Appropriate  Thought Process:  Coherent  Orientation:  Full (Time, Place, and Person)  Thought Content:  Logical  Suicidal Thoughts:  No  Homicidal Thoughts:  No  Memory:  Immediate;   Fair  Judgement:  Fair  Insight:  Fair  Psychomotor Activity:  Normal  Concentration:  Fair  Recall:  Fis[employer]Fair  Fund of Knowledge:Good  Language: Good  Akathisia:  No  Handed:  Right  AIMS (if indicated):     Assets:  Desire for Improvement Physical Health Resilience  Sleep:  Number of Hours: 6.25  Cognition: WNL  ADL's:  Intact   Mental Status Per Nursing Assessment::   On Admission:  Suicidal ideation indicated by others  Demographic Factors:  Male, Caucasian and Unemployed  Loss Factors: Financial problems/change in socioeconomic status  Historical Factors: Impulsivity  Risk Reduction Factors:   Positive social  support  Continued Clinical Symptoms:  Alcohol/Substance Abuse/Dependencies  Cognitive Features That Contribute To Risk:  Polarized thinking    Suicide Risk:  Minimal: No identifiable suicidal ideation.  Patients presenting with no risk factors but with morbid ruminations; may be classified as minimal risk based on the severity of the depressive symptoms  Follow-up Information    [facility] Go on 01/07/2017.   Why:  3:00pm appointment for assessment to be started in services Contact information: [address], [place], [place] [address] Phone: [phone]          Plan Of Care/Follow-up recommendations:  Activity:  ad lib  Antonieta PertGreg Lawson Clary, MD 01/07/2017, 8:25 AM

## 2017-01-07 NOTE — Discharge Summary (Signed)
Physician Discharge Summary Note  Patient:  Roy Rogers is an 26 y.o., male MRN:  [id number] DOB:  [birth year] Patient phone:  [phone] (home)  Patient address:   [address] [place] [address],  Total Time spent with patient: 30 minutes  Date of Admission:  01/02/2017 Date of Discharge: 01/07/2017  Reason for Admission: Per HPI-Roy Rogers an 25 y.o.male, initially presented to ED.  The report states that Roy was IVC'd by his aunt.  Upon assessment, patient was pleasant, he was smiling.  He states that he was not suicidal, he just wanted to get high.  "I just relapsed."  He states he has not been to [facility] but he was hospitalized in [facility]. He states that he has suffered though drug withdrawal in the past but vaguely recollects.  Patient appears to have poor insight.  He mentions that he has heard voices, command voices to hurt himself and he is unsure if the drugs made him hear the voices.  Patient forwarded little and covered his face under the blankets.  Principal Problem: Major depressive disorder, recurrent severe without psychotic features [facility]) Discharge Diagnoses: Patient Active Problem List   Diagnosis Date Noted  . Polysubstance (excluding opioids) dependence w/o physiol dependence (HCC) [F19.20] 01/02/2017  . 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 Major depressive disorder, recurrent severe without psychotic features (HCC) [F33.2] 01/02/2017  . 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 Anxiety   . Depression   . ETOH abuse   . Substance abuse     Past Surgical History:  Procedure Laterality Date  . none     Family History:  Family History  Problem Relation Age of Onset  . Mental illness Other   . Diabetes Other   . Early death Other    Family  Psychiatric  History: Social History:  History  Alcohol Use  . Yes    Comment: sometimes daily     History  Drug Use  . Types: Cocaine, Benzodiazepines, Marijuana    Comment: multi    Social History   Social History  . Marital status: Single    Spouse name: N/A  . Number of children: N/A  . Years of education: 10th Grade   Social History Main Topics  . Smoking status: Current Every Day Smoker    Packs/day: 0.50    Types: Cigarettes  . Smokeless tobacco: Never Used  . Alcohol use Yes     Comment: sometimes daily  . Drug use: Yes    Types: Cocaine, Benzodiazepines, Marijuana     Comment: multi  . Sexual activity: Not Asked   Other Topics Concern  . None   Social History Narrative  . None    Hospital Course:  Roy Rogers was admitted for Major depressive disorder, recurrent severe without psychotic features (HCC)  and crisis management.  Pt was treated discharged with the medications listed below under Medication List.  Medical problems were identified and treated as needed.  Home medications were restarted as appropriate.  Improvement was monitored by observation and Roy Rogers 's daily report of symptom reduction.  Emotional and mental status was monitored by daily self-inventory reports completed by Roy Rogers and clinical staff.         Roy Rogers was evaluated by the treatment team for stability and plans  for continued recovery upon discharge. Roy Rogers 's motivation was an integral factor for scheduling further treatment. Employment, transportation, bed availability, health status, family support, and any pending legal issues were also considered during hospital stay. Pt was offered further treatment options upon discharge including but not limited to Residential, Intensive Outpatient, and Outpatient treatment.  Roy Rogers will follow up with the services as listed below under Follow Up Information.     Upon completion of  this admission the patient was both mentally and medically stable for discharge denying suicidal/homicidal ideation, auditory/visual/tactile hallucinations, delusional thoughts and paranoia.    Roy Rogers responded well to treatment with Depade 50mg  , Risperdal 0.5mg   and Neurontin 400 mg without adverse effects.  Pt demonstrated improvement without reported or observed adverse effects to the point of stability appropriate for outpatient management. Pertinent labs include: CB, CMP for which outpatient follow-up is necessary for lab recheck as mentioned below. Reviewed CBC, CMP, BAL + 129 and UDS+Cocain and Benzodiazepine and THC; all unremarkable aside from noted exceptions.   Physical Findings: AIMS: Facial and Oral Movements Muscles of Facial Expression: None, normal Lips and Perioral Area: None, normal Jaw: None, normal Tongue: None, normal,Extremity Movements Upper (arms, wrists, hands, fingers): None, normal Lower (legs, knees, ankles, toes): None, normal, Trunk Movements Neck, shoulders, hips: None, normal, Overall Severity Severity of abnormal movements (highest score from questions above): None, normal Incapacitation due to abnormal movements: None, normal Patient's awareness of abnormal movements (rate only patient's report): No Awareness, Dental Status Current problems with teeth and/or dentures?: No Does patient usually wear dentures?: No  CIWA:  CIWA-Ar Total: 1 COWS:  COWS Total Score: 2  Musculoskeletal: Strength & Muscle Tone: within normal limits Gait & Station: normal Patient leans: N/A  Psychiatric Specialty Exam:SEE SRA BY MD Physical Exam  Nursing note and vitals reviewed. Constitutional: He is oriented to person, place, and time.  Neurological: He is alert and oriented to person, place, and time.  Psychiatric: He has a normal mood and affect. His behavior is normal.    ROS  Blood pressure 120/69, pulse 91, temperature 98.4 F (36.9 C), temperature  source Oral, resp. rate 16, height 5\' 9"  (1.753 m), weight 81.6 kg (180 lb).Body mass index is 26.58 kg/m.  Have you used any form of tobacco in the last 30 days? (Cigarettes, Smokeless Tobacco, Cigars, and/or Pipes): Yes  Has this patient used any form of tobacco in the last 30 days? (Cigarettes, Smokeless Tobacco, Cigars, and/or Pipes) Yes, No  Blood Alcohol level:  Lab Results  Component Value Date   ETH 129 (H) 01/01/2017    Metabolic Disorder Labs:  No results found for: HGBA1C, MPG No results found for: PROLACTIN No results found for: CHOL, TRIG, HDL, CHOLHDL, VLDL, LDLCALC  See Psychiatric Specialty Exam and Suicide Risk Assessment completed by Attending Physician prior to discharge.  Discharge destination:  Home  Is patient on multiple antipsychotic therapies at discharge:  No   Has Patient had three or more failed trials of antipsychotic monotherapy by history:  No  Recommended Plan for Multiple Antipsychotic Therapies: NA  Discharge Instructions    Diet - low sodium heart healthy    Complete by:  As directed    Discharge instructions    Complete by:  As directed    Take all medications as prescribed. Keep all follow-up appointments as scheduled.  Do not consume alcohol or use illegal drugs while on prescription medications. Report any adverse effects from your medications  to your primary care provider promptly.  In the event of recurrent symptoms or worsening symptoms, call 911, a crisis hotline, or go to the nearest emergency department for evaluation.   Increase activity slowly    Complete by:  As directed      Allergies as of 01/07/2017   No Known Allergies     Medication List    TAKE these medications     Indication  gabapentin 400 MG capsule Commonly known as:  NEURONTIN Take 1 capsule (400 mg total) by mouth 3 (three) times daily.  Indication:  Aggressive Behavior, Alcohol Withdrawal Syndrome   naltrexone 50 MG tablet Commonly known as:   DEPADE Take 1 tablet (50 mg total) by mouth daily. Start taking on:  01/08/2017  Indication:  Opioid Dependence   risperiDONE 0.5 MG tablet Commonly known as:  RISPERDAL Take 1 tablet (0.5 mg total) by mouth 2 (two) times daily.  Indication:  Psychosis   traZODone 50 MG tablet Commonly known as:  DESYREL Take 1 tablet (50 mg total) by mouth at bedtime as needed for sleep.  Indication:  Trouble Sleeping      Follow-up Information    [facility] Go on 01/07/2017.   Why:  3:00pm appointment for assessment to be started in services Contact information: [address], [place], [place] [address] Phone: [phone]          Follow-up recommendations:  Activity:  as tolerated Diet:  heart healthy  Comments:  Take all medications as prescribed. Keep all follow-up appointments as scheduled.  Do not consume alcohol or use illegal drugs while on prescription medications. Report any adverse effects from your medications to your primary care provider promptly.  In the event of recurrent symptoms or worsening symptoms, call 911, a crisis hotline, or go to the nearest emergency department for evaluation.   Signed: Oneta Rackanika N Lewis, NP 01/07/2017, 9:44 AM

## 2017-03-22 DEATH — deceased
